# Patient Record
Sex: Female | Born: 1940
Health system: Southern US, Community
[De-identification: ages and names within clinical notes are randomized; demographics above are authoritative.]

## PROBLEM LIST (undated history)

## (undated) DIAGNOSIS — E785 Hyperlipidemia, unspecified: Secondary | ICD-10-CM

## (undated) DIAGNOSIS — F329 Major depressive disorder, single episode, unspecified: Secondary | ICD-10-CM

## (undated) DIAGNOSIS — I10 Essential (primary) hypertension: Secondary | ICD-10-CM

## (undated) DIAGNOSIS — F32A Depression, unspecified: Secondary | ICD-10-CM

## (undated) DIAGNOSIS — M6281 Muscle weakness (generalized): Secondary | ICD-10-CM

## (undated) HISTORY — PX: APPENDECTOMY: SHX54

## (undated) HISTORY — PX: ABDOMINAL HYSTERECTOMY: SHX81

---
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
## 2008-02-03 ENCOUNTER — Emergency Department (HOSPITAL_COMMUNITY): Admission: EM | Admit: 2008-02-03 | Discharge: 2008-02-03 | Payer: Self-pay | Admitting: Emergency Medicine

## 2012-09-01 ENCOUNTER — Other Ambulatory Visit (HOSPITAL_COMMUNITY): Payer: Self-pay | Admitting: Internal Medicine

## 2012-09-01 DIAGNOSIS — G459 Transient cerebral ischemic attack, unspecified: Secondary | ICD-10-CM

## 2012-09-05 ENCOUNTER — Ambulatory Visit (HOSPITAL_COMMUNITY)
Admission: RE | Admit: 2012-09-05 | Discharge: 2012-09-05 | Disposition: A | Discharge status: Home / Self Care | Payer: Medicare Other | Source: Ambulatory Visit | Attending: Internal Medicine | Admitting: Internal Medicine

## 2012-09-05 DIAGNOSIS — I658 Occlusion and stenosis of other precerebral arteries: Secondary | ICD-10-CM | POA: Insufficient documentation

## 2012-09-05 DIAGNOSIS — I10 Essential (primary) hypertension: Secondary | ICD-10-CM | POA: Insufficient documentation

## 2012-09-05 DIAGNOSIS — I6529 Occlusion and stenosis of unspecified carotid artery: Secondary | ICD-10-CM | POA: Insufficient documentation

## 2012-09-05 DIAGNOSIS — G459 Transient cerebral ischemic attack, unspecified: Secondary | ICD-10-CM | POA: Insufficient documentation

## 2012-09-05 DIAGNOSIS — F17200 Nicotine dependence, unspecified, uncomplicated: Secondary | ICD-10-CM | POA: Insufficient documentation

## 2013-11-03 ENCOUNTER — Other Ambulatory Visit (HOSPITAL_COMMUNITY): Payer: Self-pay | Admitting: Internal Medicine

## 2013-11-03 DIAGNOSIS — R1011 Right upper quadrant pain: Secondary | ICD-10-CM

## 2013-11-06 ENCOUNTER — Ambulatory Visit (HOSPITAL_COMMUNITY)
Admission: RE | Admit: 2013-11-06 | Discharge: 2013-11-06 | Disposition: A | Discharge status: Home / Self Care | Payer: Medicare Other | Source: Ambulatory Visit | Attending: Internal Medicine | Admitting: Internal Medicine

## 2013-11-06 DIAGNOSIS — R160 Hepatomegaly, not elsewhere classified: Secondary | ICD-10-CM | POA: Insufficient documentation

## 2013-11-06 DIAGNOSIS — R1011 Right upper quadrant pain: Secondary | ICD-10-CM

## 2013-11-06 DIAGNOSIS — K7689 Other specified diseases of liver: Secondary | ICD-10-CM | POA: Insufficient documentation

## 2014-04-11 ENCOUNTER — Other Ambulatory Visit (HOSPITAL_COMMUNITY): Payer: Self-pay | Admitting: Internal Medicine

## 2014-04-11 DIAGNOSIS — M8580 Other specified disorders of bone density and structure, unspecified site: Secondary | ICD-10-CM

## 2014-05-01 ENCOUNTER — Other Ambulatory Visit (HOSPITAL_COMMUNITY): Payer: Medicare Other

## 2014-12-15 ENCOUNTER — Encounter (HOSPITAL_COMMUNITY): Payer: Self-pay | Admitting: *Deleted

## 2014-12-15 ENCOUNTER — Emergency Department (HOSPITAL_COMMUNITY): Payer: Medicare Other

## 2014-12-15 ENCOUNTER — Emergency Department (HOSPITAL_COMMUNITY)
Admission: EM | Admit: 2014-12-15 | Discharge: 2014-12-16 | Disposition: A | Discharge status: Home / Self Care | Payer: Medicare Other | Attending: Emergency Medicine | Admitting: Emergency Medicine

## 2014-12-15 DIAGNOSIS — R109 Unspecified abdominal pain: Secondary | ICD-10-CM

## 2014-12-15 DIAGNOSIS — K8050 Calculus of bile duct without cholangitis or cholecystitis without obstruction: Secondary | ICD-10-CM | POA: Diagnosis not present

## 2014-12-15 DIAGNOSIS — I714 Abdominal aortic aneurysm, without rupture, unspecified: Secondary | ICD-10-CM

## 2014-12-15 DIAGNOSIS — R1011 Right upper quadrant pain: Secondary | ICD-10-CM | POA: Diagnosis present

## 2014-12-15 DIAGNOSIS — Z720 Tobacco use: Secondary | ICD-10-CM | POA: Diagnosis not present

## 2014-12-15 DIAGNOSIS — R112 Nausea with vomiting, unspecified: Secondary | ICD-10-CM

## 2014-12-15 LAB — COMPREHENSIVE METABOLIC PANEL
ALK PHOS: 80 U/L (ref 38–126)
ALT: 11 U/L — ABNORMAL LOW (ref 14–54)
ANION GAP: 11 (ref 5–15)
AST: 16 U/L (ref 15–41)
Albumin: 3.9 g/dL (ref 3.5–5.0)
BUN: 10 mg/dL (ref 6–20)
CALCIUM: 9.4 mg/dL (ref 8.9–10.3)
CO2: 26 mmol/L (ref 22–32)
Chloride: 101 mmol/L (ref 101–111)
Creatinine, Ser: 0.94 mg/dL (ref 0.44–1.00)
GFR, EST NON AFRICAN AMERICAN: 59 mL/min — AB (ref >60)
Glucose, Bld: 131 mg/dL — ABNORMAL HIGH (ref 65–99)
Potassium: 3.5 mmol/L (ref 3.5–5.1)
SODIUM: 138 mmol/L (ref 135–145)
TOTAL PROTEIN: 7.4 g/dL (ref 6.5–8.1)
Total Bilirubin: 0.4 mg/dL (ref 0.3–1.2)

## 2014-12-15 LAB — CBC WITH DIFFERENTIAL/PLATELET
BASOS ABS: 0 10*3/uL (ref 0.0–0.1)
BASOS PCT: 0 % (ref 0–1)
Eosinophils Absolute: 0.1 10*3/uL (ref 0.0–0.7)
Eosinophils Relative: 0 % (ref 0–5)
HEMATOCRIT: 40.9 % (ref 36.0–46.0)
HEMOGLOBIN: 14 g/dL (ref 12.0–15.0)
Lymphocytes Relative: 13 % (ref 12–46)
Lymphs Abs: 1.7 10*3/uL (ref 0.7–4.0)
MCH: 29.7 pg (ref 26.0–34.0)
MCHC: 34.2 g/dL (ref 30.0–36.0)
MCV: 86.8 fL (ref 78.0–100.0)
MONOS PCT: 5 % (ref 3–12)
Monocytes Absolute: 0.6 10*3/uL (ref 0.1–1.0)
NEUTROS ABS: 10.9 10*3/uL — AB (ref 1.7–7.7)
NEUTROS PCT: 82 % — AB (ref 43–77)
Platelets: 311 10*3/uL (ref 150–400)
RBC: 4.71 MIL/uL (ref 3.87–5.11)
RDW: 12.6 % (ref 11.5–15.5)
WBC: 13.4 10*3/uL — AB (ref 4.0–10.5)

## 2014-12-15 LAB — LIPASE, BLOOD: LIPASE: 30 U/L (ref 22–51)

## 2014-12-15 LAB — TROPONIN I

## 2014-12-15 LAB — URINALYSIS, ROUTINE W REFLEX MICROSCOPIC
Bilirubin Urine: NEGATIVE
Glucose, UA: NEGATIVE mg/dL
Ketones, ur: NEGATIVE mg/dL
NITRITE: NEGATIVE
SPECIFIC GRAVITY, URINE: 1.025 (ref 1.005–1.030)
UROBILINOGEN UA: 0.2 mg/dL (ref 0.0–1.0)
pH: 6 (ref 5.0–8.0)

## 2014-12-15 LAB — URINE MICROSCOPIC-ADD ON

## 2014-12-15 MED ORDER — MORPHINE SULFATE (PF) 4 MG/ML IV SOLN
4.0000 mg | Freq: Once | INTRAVENOUS | Status: AC
  Administered 2014-12-15: 4 mg via INTRAVENOUS
  Filled 2014-12-15: qty 1

## 2014-12-15 MED ORDER — ONDANSETRON HCL 4 MG/2ML IJ SOLN
4.0000 mg | Freq: Once | INTRAMUSCULAR | Status: AC
  Administered 2014-12-15: 4 mg via INTRAVENOUS

## 2014-12-15 MED ORDER — ONDANSETRON HCL 4 MG/2ML IJ SOLN
4.0000 mg | Freq: Once | INTRAMUSCULAR | Status: DC
  Filled 2014-12-15: qty 2

## 2014-12-15 MED ORDER — IOHEXOL 300 MG/ML  SOLN
50.0000 mL | Freq: Once | INTRAMUSCULAR | Status: AC | PRN
  Administered 2014-12-15: 50 mL via ORAL

## 2014-12-15 MED ORDER — IOHEXOL 300 MG/ML  SOLN
100.0000 mL | Freq: Once | INTRAMUSCULAR | Status: AC | PRN
  Administered 2014-12-15: 100 mL via INTRAVENOUS

## 2014-12-15 NOTE — ED Notes (Signed)
   12/15/14 2203  Abdominal  Gastrointestinal (WDL) X  Abdomen Inspection Soft  Tenderness Tender  Last Bowel Movement Date 12/15/14  pt present w/ upper abdominal pain. Tender in the RUQ. Pt says vomited last time at 1930.

## 2014-12-15 NOTE — ED Notes (Addendum)
Pt c/o right upper abdominal pain and n/v today. Pt states she felt like this a couple of days last week as well.

## 2014-12-16 ENCOUNTER — Emergency Department (HOSPITAL_COMMUNITY): Payer: Medicare Other

## 2014-12-16 LAB — COMPREHENSIVE METABOLIC PANEL
ALK PHOS: 71 U/L (ref 38–126)
ALT: 15 U/L (ref 14–54)
AST: 24 U/L (ref 15–41)
Albumin: 3.2 g/dL — ABNORMAL LOW (ref 3.5–5.0)
Anion gap: 6 (ref 5–15)
BUN: 8 mg/dL (ref 6–20)
CALCIUM: 8.4 mg/dL — AB (ref 8.9–10.3)
CHLORIDE: 105 mmol/L (ref 101–111)
CO2: 28 mmol/L (ref 22–32)
CREATININE: 0.92 mg/dL (ref 0.44–1.00)
GFR calc non Af Amer: >60 mL/min (ref >60)
GLUCOSE: 106 mg/dL — AB (ref 65–99)
Potassium: 4 mmol/L (ref 3.5–5.1)
SODIUM: 139 mmol/L (ref 135–145)
Total Bilirubin: 0.5 mg/dL (ref 0.3–1.2)
Total Protein: 6 g/dL — ABNORMAL LOW (ref 6.5–8.1)

## 2014-12-16 LAB — CBC WITH DIFFERENTIAL/PLATELET
BASOS ABS: 0 10*3/uL (ref 0.0–0.1)
Basophils Relative: 0 % (ref 0–1)
EOS ABS: 0.1 10*3/uL (ref 0.0–0.7)
EOS PCT: 2 % (ref 0–5)
HCT: 35.3 % — ABNORMAL LOW (ref 36.0–46.0)
HEMOGLOBIN: 11.9 g/dL — AB (ref 12.0–15.0)
LYMPHS ABS: 2.5 10*3/uL (ref 0.7–4.0)
LYMPHS PCT: 37 % (ref 12–46)
MCH: 29.6 pg (ref 26.0–34.0)
MCHC: 33.7 g/dL (ref 30.0–36.0)
MCV: 87.8 fL (ref 78.0–100.0)
Monocytes Absolute: 0.4 10*3/uL (ref 0.1–1.0)
Monocytes Relative: 6 % (ref 3–12)
NEUTROS PCT: 55 % (ref 43–77)
Neutro Abs: 3.7 10*3/uL (ref 1.7–7.7)
PLATELETS: 233 10*3/uL (ref 150–400)
RBC: 4.02 MIL/uL (ref 3.87–5.11)
RDW: 12.5 % (ref 11.5–15.5)
WBC: 6.7 10*3/uL (ref 4.0–10.5)

## 2014-12-16 MED ORDER — PIPERACILLIN-TAZOBACTAM 3.375 G IVPB 30 MIN
3.3750 g | Freq: Once | INTRAVENOUS | Status: AC
  Administered 2014-12-16: 3.375 g via INTRAVENOUS
  Filled 2014-12-16: qty 50

## 2014-12-16 MED ORDER — SODIUM CHLORIDE 0.9 % IV SOLN
1000.0000 mL | Freq: Once | INTRAVENOUS | Status: AC
  Administered 2014-12-16: 1000 mL via INTRAVENOUS

## 2014-12-16 MED ORDER — ONDANSETRON HCL 4 MG PO TABS: 4.0000 mg | ORAL_TABLET | Freq: Three times a day (TID) | ORAL | Status: AC | PRN

## 2014-12-16 MED ORDER — HYDROCODONE-ACETAMINOPHEN 5-325 MG PO TABS: ORAL_TABLET | ORAL | Status: AC

## 2014-12-16 MED ORDER — ONDANSETRON HCL 4 MG/2ML IJ SOLN
INTRAMUSCULAR | Status: AC
  Filled 2014-12-16: qty 2

## 2014-12-16 MED ORDER — ONDANSETRON HCL 4 MG/2ML IJ SOLN
4.0000 mg | Freq: Once | INTRAMUSCULAR | Status: AC
  Administered 2014-12-16: 4 mg via INTRAVENOUS

## 2014-12-16 MED ORDER — SODIUM CHLORIDE 0.9 % IV SOLN
1000.0000 mL | INTRAVENOUS | Status: DC
  Administered 2014-12-16: 1000 mL via INTRAVENOUS

## 2014-12-16 NOTE — ED Provider Notes (Signed)
CSN: [id number]     Arrival date & time 12/15/14  2039 History   First MD Initiated Contact with Patient 12/15/14 2215     Chief Complaint  Patient presents with  . Abdominal Pain     (Consider location/radiation/quality/duration/timing/severity/associated sxs/prior Treatment) The history is provided by the patient and a relative.   Morgan Estrada is a 74 y.o. female with no significant past medical history but with the surgical history including appendectomy and an abdominal hysterectomy presenting with right upper quadrant pain along with nausea and multiple episodes of vomiting which started early afternoon today after eating lunch.  She describes waking this morning with a feeling of pressure  "like a gas bubble" across her bilateral mid back which improved after moving around and passing flatus.  She reports multiple episodes of bilious emesis, denies hematemesis.  She has had no diarrhea or constipation, her last bowel movement was yesterday and normal.  She denies fevers or chills.  Pain has been constant but with waxing and waning pattern.  She has found no alleviating or spur her symptoms.  She also denies dysuria, hematuria, chest pain or shortness of breath.  Her last episode of emesis occurred an hour and half before arriving here tonight.     History reviewed. No pertinent past medical history. Past Surgical History  Procedure Laterality Date  . Appendectomy    . Abdominal hysterectomy     No family history on file. Social History  Substance Use Topics  . Smoking status: Current Every Day Smoker  . Smokeless tobacco: None  . Alcohol Use: No   OB History    No data available     Review of Systems  Constitutional: Negative for fever.  HENT: Negative for congestion and sore throat.   Eyes: Negative.   Respiratory: Negative for chest tightness and shortness of breath.   Cardiovascular: Negative for chest pain.  Gastrointestinal: Negative for nausea and abdominal pain.   Genitourinary: Negative.   Musculoskeletal: Negative for joint swelling, arthralgias and neck pain.  Skin: Negative.  Negative for rash and wound.  Neurological: Negative for dizziness, weakness, light-headedness, numbness and headaches.  Psychiatric/Behavioral: Negative.       Allergies  Review of patient's allergies indicates no known allergies.  Home Medications   Prior to Admission medications   Not on File   BP 185/90 mmHg  Pulse 83  Temp(Src) 97.7 F (36.5 C) (Oral)  Resp 20  Ht  (1.626 m)  Wt 155 lb (70.308 kg)  BMI 26.59 kg/m2  SpO2 100% Physical Exam  Constitutional: She appears well-developed and well-nourished.  Appears uncomfortable  HENT:  Head: Normocephalic and atraumatic.  Eyes: Conjunctivae are normal.  Neck: Normal range of motion.  Cardiovascular: Normal rate, regular rhythm and normal heart sounds.   Pulmonary/Chest: Effort normal and breath sounds normal. She has no wheezes. She exhibits no tenderness.  Abdominal: Soft. Bowel sounds are normal. She exhibits no distension and no mass. There is tenderness in the right upper quadrant. There is guarding. There is no rebound and no CVA tenderness.  Musculoskeletal: Normal range of motion.  Neurological: She is alert.  Skin: Skin is warm and dry.  Psychiatric: She has a normal mood and affect.  Nursing note and vitals reviewed.   ED Course  Procedures (including critical care time) Labs Review Labs Reviewed  COMPREHENSIVE METABOLIC PANEL - Abnormal; Notable for the following:    Glucose, Bld 131 (*)    ALT 11 (*)  (225)[place]07GeWhite Waterrit HeckeVa N California He na on Nationsllesenk R53olWomen'Alm665 Heloise [phone] 33Zenda AlpersMarland KitchenFrederica KusterEctorMalissa HippoLibertytownMirian Mo69mFabian SharpUticaRedge Gainer [phone] PurpuraAmbrose Mantle[address]Almond LintOceans [facility]al [facility]36Rolan BuccoNolon NationsKerin SalenAkaskaCherly Hensen[address]Trinna Post  [facility] Surgery CenterBryantGerrit HeckTad [phone] [phone] 65Zenda AlpersMarland KitchenFrederica KusterOld BenningtonMalissa HippoOasisMirian Mo73mFabian SharpCedartownRedge Gainer [phone] PurpuraAmbrose Mantle26 Holly StreetAlmond LintFayette County Hospital42Rolan BuccoNolon NationsKerin SalenWindow RockCherly Hensen73 Birchpond CourtTrinna Post  [facility] LLCNelsonvilleGerrit HeckTad [phone] [phone] 48Zenda Alpers  Culture sent.  No urinary sx per pt.  Gallstones on CT without acute cholecystitis.  Suspect biliary colic as source of sx.      Burgess Amor, PA-C 12/16/14 0139  Azalia Bilis, MD 12/16/14 [id number]

## 2014-12-16 NOTE — ED Notes (Signed)
Pt given water to drink. Tolerating it well.

## 2014-12-16 NOTE — ED Notes (Signed)
MD at bedside. 

## 2014-12-16 NOTE — ED Notes (Signed)
US at bedside

## 2014-12-16 NOTE — Discharge Instructions (Signed)
°Emergency Department Resource Guide °1) Find a Doctor and Pay Out of Pocket °Although you won't have to find out who is covered by your insurance plan, it is a good idea to ask around and get recommendations. You will then need to call the office and see if the doctor you have chosen will accept you as a new patient and what types of options they offer for patients who are self-pay. Some doctors offer discounts or will set up payment plans for their patients who do not have insurance, but you will need to ask so you aren't surprised when you get to your appointment. ° °2) Contact Your Local Health Department °Not all health departments have doctors that can see patients for sick visits, but many do, so it is worth a call to see if yours does. If you don't know where your local health department is, you can check in your phone book. The CDC also has a tool to help you locate your state's health department, and many state websites also have listings of all of their local health departments. ° °3) Find a Walk-in Clinic °If your illness is not likely to be very severe or complicated, you may want to try a walk in clinic. These are popping up all over the country in pharmacies, drugstores, and shopping centers. They're usually staffed by nurse practitioners or physician assistants that have been trained to treat common illnesses and complaints. They're usually fairly quick and inexpensive. However, if you have serious medical issues or chronic medical problems, these are probably not your best option. ° °No Primary Care Doctor: °- Call Health Connect at  832-8000 - they can help you locate a primary care doctor that  accepts your insurance, provides certain services, etc. °- Physician Referral Service- 1-800-533-3463 ° °Chronic Pain Problems: °Organization         Address  Phone   Notes  °[facility]  (336) 297-2271 Patients need to be referred by their primary care doctor.  ° °Medication  Assistance: °Organization         Address  Phone   Notes  °[place] [facility] [address] °[place], [place] [address] (336) 641-8030 --Must be a resident of [place] °-- Must have NO insurance coverage whatsoever (no Medicaid/ Medicare, etc.) °-- The pt. MUST have a primary care doctor that directs their care regularly and follows them in the community °  °[facility]  (866) 331-1348   °[employer] Way  (888) 892-1162   ° °Agencies that provide inexpensive medical care: °Organization         Address  Phone   Notes  °[facility]  (336) 832-8035   °[facility]    (336) 832-7272   °[facility] [address] °[place], [place] [address] (336) 832-4777   °[facility] [address], °[place] (336) 271-4999   °[facility]    (336) 373-0678   °[facility]    (336) 272-1050   °[facility] ° [address], [place] Phone:  (336) 832-4444, Fax:  (336) 832-4440 Hours of Operation:  9 am - 6 pm, M-F.  Also accepts Medicaid/Medicare and self-pay.  °[facility] ° [address], [place] Phone: (336) 832-3150, Fax: (336) [phone]. Hours of Operation:  8:30 am - 5:30 pm, M-F.  Also accepts Medicaid and self-pay.  °[facility] [place] [address], [place] Phone: (336) 878-6027   °[facility] [address], [place], [place] (336)723-1848, Ext. 123 Mondays & Thursdays: 7-9 AM.  First 15 patients are seen on a first come, first serve basis. °  ° °Medicaid-accepting [place] Providers: ° °Organization         Address  Phone   Notes  °[facility] [address], [place] (336) 641-2100 Also accepts self-pay patients.  °[facility] [address], [place] ° (336) 856-9996   °[facility] [address], [place]  (336) 288-8857   °[facility] [address] [place] [address], [place] (336) 299-7000   °Veita Bland [address], [place]  ° (336) 373-1557 Only accepts [place] Access Medicaid patients after they have their name applied to their card.  ° °Self-Pay (no insurance) in [place]: ° °Organization         Address  Phone   Notes  °Sickle Cell Patients, [facility] [address], [place] (336) 832-1970   °[facility] [address], [place] (336) 832-4400   °[facility] ° [address], [place] (336) 992-4800   °[facility]/Dr. Osei-Bonsu ° [address] [place] [address], [place] or [address], [place] (336) [phone] Phone number for both [place] and [place] locations is the same.  °[facility] [address], [place] (336) 299-0000   °[facility] [address] [place] [address], [place] or [address] (336) 852-7530 °(336) 878-2260   °[facility] [address], [place] (336) 350-1642, phone; (336) 294-5005, fax Sees patients 1st and 3rd Saturday of every month.  Must not qualify for public or private insurance (i.e. Medicaid, Medicare, [place] Health Choice, Veterans' Benefits) • Household income should be no more than 200% of the poverty level •The clinic cannot treat you if you are pregnant or think you are pregnant • Sexually transmitted diseases are not treated at the clinic.  ° ° °Dental Care: °Organization         Address  Phone  Notes  °[place] [facility] [address], [place] (336) 641-6152 Accepts children up to age 21 who are enrolled in Medicaid or [place] Health Choice; pregnant women with a Medicaid card; and children who have applied for Medicaid or [place] Health Choice, but were declined, whose parents can pay a reduced fee at time of service.  °[place]  [facility] [place]  [address], [place] (336) 641-7733 Accepts children up to age 21 who are enrolled in Medicaid or [place] Health Choice; pregnant women with a Medicaid card; and children who have applied for Medicaid or [place] Health Choice, but were declined, whose parents can pay a reduced fee at time of service.  °[facility] ° [address], [place] (336) 641-4533 Patients are seen by appointment only. Walk-ins are not accepted. [facility] will see patients 18 years of age and older. °Monday - Tuesday (8am-5pm) °Most Wednesdays (8:30-5pm) °$30 per visit, cash only  °[facility] ° [address], [place] (336) 641-4533 Patients are seen by appointment only. Walk-ins are not accepted. [facility] will see patients 18 years of age and older. °One   Wednesday Evening (Monthly: Volunteer Based).  $30 per visit, cash only  °[facility]  (919) 537-3737 for adults; Children under age 4, call Graduate Pediatric Dentistry at (919) [phone]. Children aged 4-14, please call (919) [phone] to request a pediatric application. ° Dental services are provided in all areas of dental care including fillings, crowns and bridges, complete and partial dentures, implants, gum treatment, root canals, and extractions. Preventive care is also provided. Treatment is provided to both adults and children. °Patients are selected via a lottery and there is often a waiting list. °  °[facility] [address], °[place] ° (336) 763-8833 [email or web address] °  °[facility] [address], [place], [place] (336)723-1848, Ext. 123 Second and Fourth Thursday of each month, opens at 6:30 AM; Clinic ends at 9 AM.  Patients are seen on a first-come first-served basis, and a limited number are seen during each clinic.  ° °[facility] ° [address], [place], [place] (336) [phone]    Eligibility Requirements °You must have lived in [place], [place], or [place] counties for at least the last three months. °  You cannot be eligible for state or federal sponsored healthcare insurance, including Veterans Administration, Medicaid, or Medicare. °  You generally cannot be eligible for healthcare insurance through your employer.  °  How to apply: °Eligibility screenings are held every Tuesday and Wednesday afternoon from 1:00 pm until 4:00 pm. You do not need an appointment for the interview!  °[facility] [address], [place], [place] 336-631-2330   °[facility]  336-342-8273   °[facility]  336-703-3100   °[facility]  336-570-6415   ° °Behavioral Health Resources in the Community: °Intensive Outpatient Programs °Organization         Address  Phone  Notes  °[facility] [address], [place], [place] 336-878-6098   °[facility] [address], [place], [place] 336-832-9800   °[facility] [address], [place], [place] ° 336-882-2125   °[facility] [address],  °[place], [place] 1-800-853-5163 or 336-641-4981   °Substance Abuse Resources °Organization         Address  Phone  Notes  °[facility]  336-882-2125   °[facility]  336-784-9470   °[facility]  336-285-9073   °[facility]  336-845-3988   °Residential & Outpatient Substance Abuse Program  1-800-659-3381   °Psychological Services °Organization         Address  Phone  Notes  °[facility]  336- 832-9600   °[facility]  336- 378-7881   °[facility] [address], [place] 1-800-853-5163 or 336-641-4981   ° °Mobile Crisis Teams °Organization         Address  Phone  Notes  °[facility], Mobile Crisis Care Unit  1-877-626-1772   °Assertive °[facility] ° [address]  [place], [place] 336-834-9664   °Sharon DeEsch [address] °[place] 336-554-5454   ° °Self-Help/Support Groups °Organization         Address  Phone             Notes  °[facility] of  - variety of support groups  336- 373-1402 Call for more information  °Narcotics Anonymous (NA), [facility] [address], °[place]  2 meetings at this location  ° °  Residential Treatment Programs Organization         Address  Phone  Notes  [facility] [address],    [place]  1-610-960-4540   [facility]  [address], [place] [address], [place], [place] [phone]   [facility] [address] [place], [place] 086-578-4696 Admissions: 8am-3pm M-F  [facility] [address],    [place], [place] 295-284-1324   [facility] [address] [place], [place], [place] 401-027-2536   [facility] [address]  [place], [place] 644-034-7425   [facility] Intensive Outpatient [address], [place], [place] 956-387-5643   [facility] ([facility] [address] [place]  [place], [place] 3-295-188-4166 or 831-833-1834   [facility] ([facility] [address], [place], [place] 323-557-3220 Accepts Medicaid  [facility] [address],  [place] 2-542-706-2376 Substance Abuse/Addiction Treatment   [facility] Organization         Address  Phone  Notes  [facility]  (781)566-6649   Angie Fava, PhD [address] Ervin Knack [place], [place]   470 273 8893 or (864)338-6732   [facility]   [address] [place], [place] (971)318-6048   [facility] [address] [place], [place] 438-188-8811 Insurance/Medicaid/sponsorship through [facility] and Families [address]                                    [place], [place] [phone] Therapy/tele-psych/case    [facility] [address], [place] [phone]    Dr. Lolly Mustache  502-849-2118   [facility]  [employer] [facility] Dept. 1) [address], [place] 2) [address] [place] 3)  [address], [place] [phone]  (519)[phone]   [facility] Child Abuse Hotline 828-504-4650 or 902-340-0006 (After Hours)      Eat a bland diet, avoiding greasy, fatty, fried foods, as well as spicy and acidic foods or beverages.  Avoid eating within the hour or 2 before going to bed or laying down.  Also avoid teas, colas, coffee, chocolate, pepermint and spearment.  Take the prescriptions as directed. Your CT scan showed an incidental finding of: "34 mm abdominal aortic aneurysm. Recommend followup by ultrasound in 3 years. This recommendation follows ACR consensus guidelines:  White Paper of the ACR Incidental Findings Committee II on Vascular Findings. J Am Coll Radiol 2013; 10:789-794."   Call your regular medical doctor tomorrow to schedule a follow up appointment in the next 2 days. Call the Vascular Surgeon tomorrow to schedule a follow up appointment within the next week regarding your aneurysm. Call the General Surgeon tomorrow to schedule a follow up appointment within the next week. Return to the Emergency Department immediately if worsening.

## 2014-12-16 NOTE — ED Provider Notes (Signed)
Pt received at change of shift with labs and [place] abd pending. Udip appears contaminated, UC is pending. Repeat labs reassuring. [place] without acute abnl. Pt has not required any further pain or nausea meds while in the ED since overnight last night. Pt has tol PO well while in the ED without N/V.  No stooling while in the ED.  Abd benign, VSS. Feels better and wants to go home now. Dx and testing d/w pt and family.  Questions answered.  Verb understanding, agreeable to d/c home with outpt f/u.   Results for orders placed or performed during the hospital encounter of 12/15/14  Comprehensive metabolic panel  Result Value Ref Range   Sodium 138 135 - 145 mmol/L   Potassium 3.5 3.5 - 5.1 mmol/L   Chloride 101 101 - 111 mmol/L   CO2 26 22 - 32 mmol/L   Glucose, Bld 131 (H) 65 - 99 mg/dL   BUN 10 6 - 20 mg/dL   Creatinine, Ser 8.11 0.44 - 1.00 mg/dL   Calcium 9.4 8.9 - 91.4 mg/dL   Total Protein 7.4 6.5 - 8.1 g/dL   Albumin 3.9 3.5 - 5.0 g/dL   AST 16 15 - 41 U/L   ALT 11 (L) 14 - 54 U/L   Alkaline Phosphatase 80 38 - 126 U/L   Total Bilirubin 0.4 0.3 - 1.2 mg/dL   GFR calc non Af Amer 59 (L) >60 mL/min   GFR calc Af Amer >60 >60 mL/min   Anion gap 11 5 - 15  CBC with Differential  Result Value Ref Range   WBC 13.4 (H) 4.0 - 10.5 K/uL   RBC 4.71 3.87 - 5.11 MIL/uL   Hemoglobin 14.0 12.0 - 15.0 g/dL   HCT 78.2 95.6 - 21.3 %   MCV 86.8 78.0 - 100.0 fL   MCH 29.7 26.0 - 34.0 pg   MCHC 34.2 30.0 - 36.0 g/dL   RDW 08.6 57.8 - 46.9 %   Platelets 311 150 - 400 K/uL   Neutrophils Relative % 82 (H) 43 - 77 %   Neutro Abs 10.9 (H) 1.7 - 7.7 K/uL   Lymphocytes Relative 13 12 - 46 %   Lymphs Abs 1.7 0.7 - 4.0 K/uL   Monocytes Relative 5 3 - 12 %   Monocytes Absolute 0.6 0.1 - 1.0 K/uL   Eosinophils Relative 0 0 - 5 %   Eosinophils Absolute 0.1 0.0 - 0.7 K/uL   Basophils Relative 0 0 - 1 %   Basophils Absolute 0.0 0.0 - 0.1 K/uL  Urinalysis, Routine w reflex microscopic (not at [facility])  Result  Value Ref Range   Color, Urine YELLOW YELLOW   APPearance HAZY (A) CLEAR   Specific Gravity, Urine 1.025 1.005 - 1.030   pH 6.0 5.0 - 8.0   Glucose, UA NEGATIVE NEGATIVE mg/dL   Hgb urine dipstick TRACE (A) NEGATIVE   Bilirubin Urine NEGATIVE NEGATIVE   Ketones, ur NEGATIVE NEGATIVE mg/dL   Protein, ur TRACE (A) NEGATIVE mg/dL   Urobilinogen, UA 0.2 0.0 - 1.0 mg/dL   Nitrite NEGATIVE NEGATIVE   Leukocytes, UA SMALL (A) NEGATIVE  Lipase, blood  Result Value Ref Range   Lipase 30 22 - 51 U/L  Troponin I  Result Value Ref Range   Troponin I <0.03 <0.031 ng/mL  Urine microscopic-add on  Result Value Ref Range   Squamous Epithelial / LPF MANY (A) RARE   WBC, UA 7-10 <3 WBC/hpf   RBC / HPF 3-6 <  3 RBC/hpf   Bacteria, UA MANY (A) RARE   Crystals CA OXALATE CRYSTALS (A) NEGATIVE  CBC with Differential/Platelet  Result Value Ref Range   WBC 6.7 4.0 - 10.5 K/uL   RBC 4.02 3.87 - 5.11 MIL/uL   Hemoglobin 11.9 (L) 12.0 - 15.0 g/dL   HCT 16.1 (L) 09.6 - 04.5 %   MCV 87.8 78.0 - 100.0 fL   MCH 29.6 26.0 - 34.0 pg   MCHC 33.7 30.0 - 36.0 g/dL   RDW 40.9 81.1 - 91.4 %   Platelets 233 150 - 400 K/uL   Neutrophils Relative % 55 43 - 77 %   Neutro Abs 3.7 1.7 - 7.7 K/uL   Lymphocytes Relative 37 12 - 46 %   Lymphs Abs 2.5 0.7 - 4.0 K/uL   Monocytes Relative 6 3 - 12 %   Monocytes Absolute 0.4 0.1 - 1.0 K/uL   Eosinophils Relative 2 0 - 5 %   Eosinophils Absolute 0.1 0.0 - 0.7 K/uL   Basophils Relative 0 0 - 1 %   Basophils Absolute 0.0 0.0 - 0.1 K/uL  Comprehensive metabolic panel  Result Value Ref Range   Sodium 139 135 - 145 mmol/L   Potassium 4.0 3.5 - 5.1 mmol/L   Chloride 105 101 - 111 mmol/L   CO2 28 22 - 32 mmol/L   Glucose, Bld 106 (H) 65 - 99 mg/dL   BUN 8 6 - 20 mg/dL   Creatinine, Ser 7.82 0.44 - 1.00 mg/dL   Calcium 8.4 (L) 8.9 - 10.3 mg/dL   Total Protein 6.0 (L) 6.5 - 8.1 g/dL   Albumin 3.2 (L) 3.5 - 5.0 g/dL   AST 24 15 - 41 U/L   ALT 15 14 - 54 U/L   Alkaline  Phosphatase 71 38 - 126 U/L   Total Bilirubin 0.5 0.3 - 1.2 mg/dL   GFR calc non Af Amer >60 >60 mL/min   GFR calc Af Amer >60 >60 mL/min   Anion gap 6 5 - 15   US Abdomen Limited 12/16/2014   CLINICAL DATA:  Right upper quadrant pain. Cholelithiasis suspected on recent CT.  EXAM: US ABDOMEN LIMITED - RIGHT UPPER QUADRANT  COMPARISON:  11/06/2013  FINDINGS: Gallbladder:  No gallstones or wall thickening visualized. No sonographic Murphy sign noted.  Common bile duct:  Diameter: 4 mm, within normal limits  Liver:  Diffusely increased echogenicity of the hepatic parenchyma, consistent with diffuse hepatic steatosis. No focal mass lesion identified.  IMPRESSION: No evidence of cholelithiasis or biliary dilatation.  Diffuse hepatic steatosis.   Electronically Signed   By: Myles Rosenthal M.D.   On: 12/16/2014 09:37      Samuel Jester, DO 12/16/14 1159

## 2014-12-18 LAB — URINE CULTURE

## 2014-12-26 ENCOUNTER — Other Ambulatory Visit (HOSPITAL_COMMUNITY): Payer: Self-pay | Admitting: Internal Medicine

## 2014-12-26 DIAGNOSIS — R1011 Right upper quadrant pain: Secondary | ICD-10-CM

## 2015-01-02 ENCOUNTER — Encounter (HOSPITAL_COMMUNITY): Payer: Self-pay

## 2015-01-02 ENCOUNTER — Encounter (HOSPITAL_COMMUNITY)
Admission: RE | Admit: 2015-01-02 | Discharge: 2015-01-02 | Disposition: A | Discharge status: Home / Self Care | Payer: Medicare Other | Source: Ambulatory Visit | Attending: Internal Medicine | Admitting: Internal Medicine

## 2015-01-02 DIAGNOSIS — R1011 Right upper quadrant pain: Secondary | ICD-10-CM | POA: Diagnosis not present

## 2015-01-02 DIAGNOSIS — R112 Nausea with vomiting, unspecified: Secondary | ICD-10-CM | POA: Diagnosis not present

## 2015-01-02 MED ORDER — TECHNETIUM TC 99M MEBROFENIN IV KIT
5.0000 | PACK | Freq: Once | INTRAVENOUS | Status: DC | PRN
  Administered 2015-01-02: 5.3 via INTRAVENOUS
  Filled 2015-01-02: qty 6

## 2015-01-02 MED ORDER — STERILE WATER FOR INJECTION IJ SOLN
INTRAMUSCULAR | Status: AC
  Administered 2015-01-02: 1.41 mL
  Filled 2015-01-02: qty 10

## 2015-01-02 MED ORDER — SODIUM CHLORIDE 0.9 % IJ SOLN
INTRAMUSCULAR | Status: AC
  Filled 2015-01-02: qty 30

## 2015-01-02 MED ORDER — SINCALIDE 5 MCG IJ SOLR
INTRAMUSCULAR | Status: AC
  Administered 2015-01-02: 1.41 ug
  Filled 2015-01-02: qty 5

## 2015-08-10 ENCOUNTER — Emergency Department (HOSPITAL_COMMUNITY): Payer: Medicare Other | Admitting: Anesthesiology

## 2015-08-10 ENCOUNTER — Emergency Department (HOSPITAL_COMMUNITY): Payer: Medicare Other

## 2015-08-10 ENCOUNTER — Encounter (HOSPITAL_COMMUNITY): Payer: Self-pay | Admitting: Emergency Medicine

## 2015-08-10 ENCOUNTER — Inpatient Hospital Stay (HOSPITAL_COMMUNITY)
Admission: EM | Admit: 2015-08-10 | Discharge: 2015-08-18 | DRG: 326 | Disposition: A | Discharge status: Home Health | Payer: Medicare Other | Attending: Surgery | Admitting: Surgery

## 2015-08-10 ENCOUNTER — Encounter (HOSPITAL_COMMUNITY): Admission: EM | Disposition: A | Discharge status: Home Health | Payer: Self-pay | Source: Home / Self Care

## 2015-08-10 DIAGNOSIS — R000 Tachycardia, unspecified: Secondary | ICD-10-CM | POA: Diagnosis present

## 2015-08-10 DIAGNOSIS — Z90710 Acquired absence of both cervix and uterus: Secondary | ICD-10-CM | POA: Diagnosis not present

## 2015-08-10 DIAGNOSIS — K275 Chronic or unspecified peptic ulcer, site unspecified, with perforation: Secondary | ICD-10-CM | POA: Diagnosis present

## 2015-08-10 DIAGNOSIS — Z9049 Acquired absence of other specified parts of digestive tract: Secondary | ICD-10-CM

## 2015-08-10 DIAGNOSIS — K659 Peritonitis, unspecified: Secondary | ICD-10-CM | POA: Diagnosis present

## 2015-08-10 DIAGNOSIS — B9681 Helicobacter pylori [H. pylori] as the cause of diseases classified elsewhere: Secondary | ICD-10-CM | POA: Diagnosis not present

## 2015-08-10 DIAGNOSIS — K255 Chronic or unspecified gastric ulcer with perforation: Secondary | ICD-10-CM | POA: Diagnosis present

## 2015-08-10 DIAGNOSIS — R198 Other specified symptoms and signs involving the digestive system and abdomen: Secondary | ICD-10-CM | POA: Diagnosis present

## 2015-08-10 DIAGNOSIS — Z7982 Long term (current) use of aspirin: Secondary | ICD-10-CM | POA: Diagnosis not present

## 2015-08-10 DIAGNOSIS — F17210 Nicotine dependence, cigarettes, uncomplicated: Secondary | ICD-10-CM | POA: Diagnosis present

## 2015-08-10 DIAGNOSIS — J449 Chronic obstructive pulmonary disease, unspecified: Secondary | ICD-10-CM | POA: Diagnosis present

## 2015-08-10 DIAGNOSIS — K668 Other specified disorders of peritoneum: Secondary | ICD-10-CM

## 2015-08-10 DIAGNOSIS — R410 Disorientation, unspecified: Secondary | ICD-10-CM | POA: Diagnosis present

## 2015-08-10 DIAGNOSIS — M549 Dorsalgia, unspecified: Secondary | ICD-10-CM | POA: Diagnosis present

## 2015-08-10 DIAGNOSIS — I714 Abdominal aortic aneurysm, without rupture: Secondary | ICD-10-CM | POA: Diagnosis present

## 2015-08-10 DIAGNOSIS — R1084 Generalized abdominal pain: Secondary | ICD-10-CM | POA: Diagnosis present

## 2015-08-10 HISTORY — PX: LAPAROTOMY: SHX154

## 2015-08-10 LAB — BASIC METABOLIC PANEL
ANION GAP: 10 (ref 5–15)
BUN: 13 mg/dL (ref 6–20)
CALCIUM: 9 mg/dL (ref 8.9–10.3)
CHLORIDE: 100 mmol/L — AB (ref 101–111)
CO2: 25 mmol/L (ref 22–32)
CREATININE: 0.98 mg/dL (ref 0.44–1.00)
GFR, EST NON AFRICAN AMERICAN: 56 mL/min — AB (ref >60)
Glucose, Bld: 131 mg/dL — ABNORMAL HIGH (ref 65–99)
POTASSIUM: 4.4 mmol/L (ref 3.5–5.1)
SODIUM: 135 mmol/L (ref 135–145)

## 2015-08-10 LAB — URINALYSIS, ROUTINE W REFLEX MICROSCOPIC
BILIRUBIN URINE: NEGATIVE
Glucose, UA: NEGATIVE mg/dL
HGB URINE DIPSTICK: NEGATIVE
Ketones, ur: NEGATIVE mg/dL
NITRITE: NEGATIVE
Protein, ur: NEGATIVE mg/dL
Specific Gravity, Urine: 1.01 (ref 1.005–1.030)
pH: 7.5 (ref 5.0–8.0)

## 2015-08-10 LAB — CBC WITH DIFFERENTIAL/PLATELET
BASOS ABS: 0 10*3/uL (ref 0.0–0.1)
Basophils Relative: 0 %
EOS ABS: 0 10*3/uL (ref 0.0–0.7)
EOS PCT: 0 %
HCT: 42 % (ref 36.0–46.0)
HEMOGLOBIN: 13.8 g/dL (ref 12.0–15.0)
LYMPHS ABS: 1.5 10*3/uL (ref 0.7–4.0)
Lymphocytes Relative: 7 %
MCH: 28.6 pg (ref 26.0–34.0)
MCHC: 32.9 g/dL (ref 30.0–36.0)
MCV: 87.1 fL (ref 78.0–100.0)
Monocytes Absolute: 0.9 10*3/uL (ref 0.1–1.0)
Monocytes Relative: 4 %
NEUTROS PCT: 89 %
Neutro Abs: 19.6 10*3/uL — ABNORMAL HIGH (ref 1.7–7.7)
PLATELETS: 219 10*3/uL (ref 150–400)
RBC: 4.82 MIL/uL (ref 3.87–5.11)
RDW: 14.2 % (ref 11.5–15.5)
WBC: 22 10*3/uL — AB (ref 4.0–10.5)

## 2015-08-10 LAB — CBC
HCT: 39.5 % (ref 36.0–46.0)
HEMOGLOBIN: 13.5 g/dL (ref 12.0–15.0)
MCH: 29.2 pg (ref 26.0–34.0)
MCHC: 34.2 g/dL (ref 30.0–36.0)
MCV: 85.5 fL (ref 78.0–100.0)
PLATELETS: 189 10*3/uL (ref 150–400)
RBC: 4.62 MIL/uL (ref 3.87–5.11)
RDW: 14.5 % (ref 11.5–15.5)
WBC: 22.8 10*3/uL — ABNORMAL HIGH (ref 4.0–10.5)

## 2015-08-10 LAB — HEPATIC FUNCTION PANEL
ALT: 13 U/L — ABNORMAL LOW (ref 14–54)
AST: 19 U/L (ref 15–41)
Albumin: 3.7 g/dL (ref 3.5–5.0)
Alkaline Phosphatase: 82 U/L (ref 38–126)
BILIRUBIN INDIRECT: 0.5 mg/dL (ref 0.3–0.9)
Bilirubin, Direct: 0.2 mg/dL (ref 0.1–0.5)
TOTAL PROTEIN: 7.2 g/dL (ref 6.5–8.1)
Total Bilirubin: 0.7 mg/dL (ref 0.3–1.2)

## 2015-08-10 LAB — URINE MICROSCOPIC-ADD ON

## 2015-08-10 LAB — CREATININE, SERUM
CREATININE: 0.86 mg/dL (ref 0.44–1.00)
GFR calc Af Amer: >60 mL/min (ref >60)

## 2015-08-10 LAB — LACTIC ACID, PLASMA
LACTIC ACID, VENOUS: 2 mmol/L (ref 0.5–2.0)
LACTIC ACID, VENOUS: 1.3 mmol/L (ref 0.5–2.0)

## 2015-08-10 LAB — LIPASE, BLOOD: LIPASE: 24 U/L (ref 11–51)

## 2015-08-10 SURGERY — LAPAROTOMY, EXPLORATORY
Anesthesia: General

## 2015-08-10 MED ORDER — DIPHENHYDRAMINE HCL 50 MG/ML IJ SOLN: 12.5000 mg | Freq: Four times a day (QID) | INTRAMUSCULAR | Status: DC | PRN

## 2015-08-10 MED ORDER — ONDANSETRON HCL 4 MG/2ML IJ SOLN
INTRAMUSCULAR | Status: DC | PRN
  Administered 2015-08-10: 4 mg via INTRAVENOUS

## 2015-08-10 MED ORDER — DIPHENHYDRAMINE HCL 12.5 MG/5ML PO ELIX: 12.5000 mg | ORAL_SOLUTION | Freq: Four times a day (QID) | ORAL | Status: DC | PRN

## 2015-08-10 MED ORDER — ENOXAPARIN SODIUM 40 MG/0.4ML ~~LOC~~ SOLN
40.0000 mg | SUBCUTANEOUS | Status: DC
  Administered 2015-08-11 – 2015-08-17 (×7): 40 mg via SUBCUTANEOUS
  Filled 2015-08-10 (×8): qty 0.4

## 2015-08-10 MED ORDER — DIATRIZOATE MEGLUMINE & SODIUM 66-10 % PO SOLN
ORAL | Status: AC
  Administered 2015-08-10: 30 mL
  Filled 2015-08-10: qty 30

## 2015-08-10 MED ORDER — CETYLPYRIDINIUM CHLORIDE 0.05 % MT LIQD
7.0000 mL | Freq: Two times a day (BID) | OROMUCOSAL | Status: DC
  Administered 2015-08-11 – 2015-08-18 (×13): 7 mL via OROMUCOSAL

## 2015-08-10 MED ORDER — LIDOCAINE HCL (CARDIAC) 20 MG/ML IV SOLN
INTRAVENOUS | Status: DC | PRN
  Administered 2015-08-10: 100 mg via INTRAVENOUS

## 2015-08-10 MED ORDER — METOCLOPRAMIDE HCL 5 MG/ML IJ SOLN
INTRAMUSCULAR | Status: DC | PRN
  Administered 2015-08-10: 10 mg via INTRAVENOUS

## 2015-08-10 MED ORDER — SODIUM CHLORIDE 0.9 % IR SOLN
Status: DC | PRN
  Administered 2015-08-10 (×2): 1000 mL

## 2015-08-10 MED ORDER — LACTATED RINGERS IV SOLN
INTRAVENOUS | Status: DC | PRN
  Administered 2015-08-10 (×2): via INTRAVENOUS

## 2015-08-10 MED ORDER — SUGAMMADEX SODIUM 200 MG/2ML IV SOLN
INTRAVENOUS | Status: DC | PRN
  Administered 2015-08-10: 200 mg via INTRAVENOUS

## 2015-08-10 MED ORDER — ROCURONIUM BROMIDE 100 MG/10ML IV SOLN
INTRAVENOUS | Status: DC | PRN
  Administered 2015-08-10: 5 mg via INTRAVENOUS
  Administered 2015-08-10: 30 mg via INTRAVENOUS

## 2015-08-10 MED ORDER — KCL IN DEXTROSE-NACL 20-5-0.45 MEQ/L-%-% IV SOLN
INTRAVENOUS | Status: DC
Start: 2015-08-10 — End: 2015-08-12
  Administered 2015-08-10 – 2015-08-11 (×2): via INTRAVENOUS
  Filled 2015-08-10 (×3): qty 1000

## 2015-08-10 MED ORDER — DEXAMETHASONE SODIUM PHOSPHATE 10 MG/ML IJ SOLN
INTRAMUSCULAR | Status: DC | PRN
  Administered 2015-08-10: 10 mg via INTRAVENOUS

## 2015-08-10 MED ORDER — PHENYLEPHRINE HCL 10 MG/ML IJ SOLN
INTRAMUSCULAR | Status: DC | PRN
  Administered 2015-08-10 (×3): 80 ug via INTRAVENOUS

## 2015-08-10 MED ORDER — SODIUM CHLORIDE 0.9% FLUSH: 9.0000 mL | INTRAVENOUS | Status: DC | PRN

## 2015-08-10 MED ORDER — SODIUM CHLORIDE 0.9 % IV BOLUS (SEPSIS)
1000.0000 mL | Freq: Once | INTRAVENOUS | Status: AC
  Administered 2015-08-10: 1000 mL via INTRAVENOUS

## 2015-08-10 MED ORDER — IPRATROPIUM-ALBUTEROL 0.5-2.5 (3) MG/3ML IN SOLN
3.0000 mL | RESPIRATORY_TRACT | Status: DC
  Administered 2015-08-10: 3 mL via RESPIRATORY_TRACT
  Filled 2015-08-10: qty 3

## 2015-08-10 MED ORDER — PIPERACILLIN-TAZOBACTAM 3.375 G IVPB
3.3750 g | Freq: Three times a day (TID) | INTRAVENOUS | Status: AC
  Administered 2015-08-10 – 2015-08-16 (×19): 3.375 g via INTRAVENOUS
  Filled 2015-08-10 (×20): qty 50

## 2015-08-10 MED ORDER — MORPHINE SULFATE (PF) 4 MG/ML IV SOLN
6.0000 mg | Freq: Once | INTRAVENOUS | Status: AC
  Administered 2015-08-10: 6 mg via INTRAVENOUS
  Filled 2015-08-10: qty 2

## 2015-08-10 MED ORDER — PIPERACILLIN-TAZOBACTAM 3.375 G IVPB 30 MIN
3.3750 g | Freq: Once | INTRAVENOUS | Status: AC
  Administered 2015-08-10: 3.375 g via INTRAVENOUS
  Filled 2015-08-10: qty 50

## 2015-08-10 MED ORDER — FENTANYL CITRATE (PF) 100 MCG/2ML IJ SOLN
INTRAMUSCULAR | Status: DC | PRN
  Administered 2015-08-10: 50 ug via INTRAVENOUS
  Administered 2015-08-10: 100 ug via INTRAVENOUS

## 2015-08-10 MED ORDER — PROPOFOL 10 MG/ML IV BOLUS
INTRAVENOUS | Status: DC | PRN
  Administered 2015-08-10: 150 mg via INTRAVENOUS

## 2015-08-10 MED ORDER — PANTOPRAZOLE SODIUM 40 MG IV SOLR
40.0000 mg | Freq: Two times a day (BID) | INTRAVENOUS | Status: DC
  Administered 2015-08-10 – 2015-08-16 (×12): 40 mg via INTRAVENOUS
  Filled 2015-08-10 (×13): qty 40

## 2015-08-10 MED ORDER — IPRATROPIUM-ALBUTEROL 0.5-2.5 (3) MG/3ML IN SOLN: 3.0000 mL | Freq: Four times a day (QID) | RESPIRATORY_TRACT | Status: DC | PRN

## 2015-08-10 MED ORDER — ONDANSETRON HCL 4 MG/2ML IJ SOLN
4.0000 mg | Freq: Four times a day (QID) | INTRAMUSCULAR | Status: DC | PRN
  Administered 2015-08-10: 4 mg via INTRAVENOUS

## 2015-08-10 MED ORDER — MORPHINE SULFATE 2 MG/ML IV SOLN
INTRAVENOUS | Status: DC
  Administered 2015-08-10: 23:00:00 via INTRAVENOUS
  Administered 2015-08-11: 13 mL via INTRAVENOUS
  Administered 2015-08-11: 8 mg via INTRAVENOUS
  Administered 2015-08-11: 0 mg via INTRAVENOUS
  Administered 2015-08-11: 1 mg via INTRAVENOUS
  Administered 2015-08-12: 7 mL via INTRAVENOUS
  Administered 2015-08-12: 0 mL via INTRAVENOUS

## 2015-08-10 MED ORDER — NALOXONE HCL 0.4 MG/ML IJ SOLN: 0.4000 mg | INTRAMUSCULAR | Status: DC | PRN

## 2015-08-10 MED ORDER — ONDANSETRON HCL 4 MG/2ML IJ SOLN
4.0000 mg | Freq: Four times a day (QID) | INTRAMUSCULAR | Status: DC | PRN
  Administered 2015-08-11 – 2015-08-16 (×5): 4 mg via INTRAVENOUS
  Filled 2015-08-10 (×6): qty 2

## 2015-08-10 MED ORDER — ONDANSETRON HCL 4 MG/2ML IJ SOLN
4.0000 mg | Freq: Once | INTRAMUSCULAR | Status: AC
  Administered 2015-08-10: 4 mg via INTRAVENOUS
  Filled 2015-08-10: qty 2

## 2015-08-10 MED ORDER — HYDROMORPHONE HCL 1 MG/ML IJ SOLN
0.2500 mg | INTRAMUSCULAR | Status: DC | PRN
  Administered 2015-08-10 (×4): 0.5 mg via INTRAVENOUS

## 2015-08-10 MED ORDER — IOPAMIDOL (ISOVUE-300) INJECTION 61%
100.0000 mL | Freq: Once | INTRAVENOUS | Status: AC | PRN
  Administered 2015-08-10: 100 mL via INTRAVENOUS

## 2015-08-10 MED ORDER — ONDANSETRON 4 MG PO TBDP
4.0000 mg | ORAL_TABLET | Freq: Four times a day (QID) | ORAL | Status: DC | PRN
  Filled 2015-08-10: qty 1

## 2015-08-10 SURGICAL SUPPLY — 38 items
APPLICATOR COTTON TIP 6IN STRL (MISCELLANEOUS) ×3 IMPLANT
BLADE EXTENDED COATED 6.5IN (ELECTRODE) IMPLANT
BLADE HEX COATED 2.75 (ELECTRODE) ×3 IMPLANT
COVER MAYO STAND STRL (DRAPES) IMPLANT
COVER SURGICAL LIGHT HANDLE (MISCELLANEOUS) ×3 IMPLANT
DRAPE LAPAROSCOPIC ABDOMINAL (DRAPES) ×3 IMPLANT
DRAPE WARM FLUID 44X44 (DRAPE) IMPLANT
DRSG OPSITE POSTOP 4X8 (GAUZE/BANDAGES/DRESSINGS) ×3 IMPLANT
ELECT REM PT RETURN 9FT ADLT (ELECTROSURGICAL) ×3
ELECTRODE REM PT RTRN 9FT ADLT (ELECTROSURGICAL) ×1 IMPLANT
GAUZE SPONGE 4X4 12PLY STRL (GAUZE/BANDAGES/DRESSINGS) ×3 IMPLANT
GLOVE BIOGEL PI IND STRL 7.0 (GLOVE) ×1 IMPLANT
GLOVE BIOGEL PI INDICATOR 7.0 (GLOVE) ×2
GLOVE SURG ORTHO 8.0 STRL STRW (GLOVE) ×3 IMPLANT
GOWN STRL REUS W/TWL LRG LVL3 (GOWN DISPOSABLE) ×3 IMPLANT
GOWN STRL REUS W/TWL XL LVL3 (GOWN DISPOSABLE) ×6 IMPLANT
HANDLE SUCTION POOLE (INSTRUMENTS) IMPLANT
KIT BASIN OR (CUSTOM PROCEDURE TRAY) ×3 IMPLANT
NS IRRIG 1000ML POUR BTL (IV SOLUTION) ×3 IMPLANT
PACK GENERAL/GYN (CUSTOM PROCEDURE TRAY) ×3 IMPLANT
SPONGE LAP 18X18 X RAY DECT (DISPOSABLE) IMPLANT
STAPLER VISISTAT 35W (STAPLE) ×3 IMPLANT
SUCTION POOLE HANDLE (INSTRUMENTS)
SUT NOV 1 T60/GS (SUTURE) IMPLANT
SUT NOVA NAB GS-21 0 18 T12 DT (SUTURE) ×12 IMPLANT
SUT SILK 2 0 (SUTURE)
SUT SILK 2 0 SH CR/8 (SUTURE) IMPLANT
SUT SILK 2-0 18XBRD TIE 12 (SUTURE) IMPLANT
SUT SILK 3 0 (SUTURE)
SUT SILK 3 0 SH CR/8 (SUTURE) IMPLANT
SUT SILK 3-0 18XBRD TIE 12 (SUTURE) IMPLANT
SUT VICRYL 2 0 18  UND BR (SUTURE)
SUT VICRYL 2 0 18 UND BR (SUTURE) IMPLANT
TOWEL OR 17X26 10 PK STRL BLUE (TOWEL DISPOSABLE) ×6 IMPLANT
TRAY FOLEY W/METER SILVER 14FR (SET/KITS/TRAYS/PACK) IMPLANT
TRAY FOLEY W/METER SILVER 16FR (SET/KITS/TRAYS/PACK) IMPLANT
WATER STERILE IRR 1500ML POUR (IV SOLUTION) ×3 IMPLANT
YANKAUER SUCT BULB TIP NO VENT (SUCTIONS) IMPLANT

## 2015-08-10 NOTE — Op Note (Signed)
NAMRennie Estrada:  Stelzer, Xiao                ACCOUNT NO.:  [id number]  MEDICAL RECORD NO.:  [id number]  LOCATION:  1225                         [facility]  PHYSICIAN:  Velora Hecklerodd M Kellina Dreese, MD      DATE OF BIRTH:  [birth year]  DATE OF PROCEDURE:  08/10/2015                              OPERATIVE REPORT   PREOPERATIVE DIAGNOSIS:  Perforated viscus.  POSTOPERATIVE DIAGNOSIS:  Perforated pyloric channel ulcer.  PROCEDURE:  Exploratory laparotomy with Cheree DittoGraham patch closure of perforated pyloric channel ulcer.  SURGEON:  Velora Hecklerodd M Ellwyn Ergle, MD, FACS  ANESTHESIA:  General per Dr. Claybon Jabsaniel Singer.  ESTIMATED BLOOD LOSS:  Minimal.  PREPARATION:  ChloraPrep.  COMPLICATIONS:  None.  INDICATIONS:  The patient is a 75 year old white female, who presented to the emergency department at [facility] with a 2-3 day history of epigastric abdominal pain.  CT scan of the abdomen and pelvis demonstrated free air consistent with perforated viscus.  White blood cell count was elevated at 22,000.  The patient had been taking Voltaren for the past 3 weeks for back pain.  The [facility].  She was prepared and taken urgently to the operating room for laparotomy.  BODY OF REPORT:  Procedure was done in OR #1 at [facility].  The patient was brought to the operating room, placed in supine position on the operating room table.  Following administration general anesthesia, the patient was prepped and draped in the usual aseptic fashion.  After ascertaining that an adequate level of anesthesia had been achieved, an upper midline abdominal incision was made with a #10 blade.  Dissection was carried through subcutaneous tissues.  Fascia was incised in the midline and the peritoneal cavity was entered cautiously.  There was green fluid in the upper abdomen. There was fibrinous exudate on the liver and  gallbladder and omentum. Exploration reveals a small 4 mm punctate perforated ulcer on the anterior pyloric channel.  The omentum is mobilized so as to reach this area without tension.  Two 2-0 silk sutures are placed in the usual fashion through the bowel wall and the omental flap is placed over the perforated ulcer and the silk sutures were tied securely occluding the perforation in the fashion of a Graham patch.  Abdomen was then irrigated copiously with warm saline.  This was evacuated.  No further spillage was identified.  Good hemostasis was noted.  Nasogastric tube was properly positioned in the mid body of the stomach.  Anterior abdominal wall was closed with interrupted 0 Novafil simple sutures. Subcutaneous tissues were irrigated.  Skin was closed with stainless steel staples.  Sterile dressings were applied.  The patient was awakened from anesthesia and brought to the recovery room.  The patient tolerated the procedure well.   Velora Hecklerodd M. Khiana Camino, MD, [facility] Office: [phone]    TMG/MEDQ  D:  08/10/2015  T:  08/10/2015  Job:  [id number]  cc:   Catalina PizzaZach Hall, M.D. Fax: [phone]: [phone]  Dr. Clayborne DanaMesner

## 2015-08-10 NOTE — Brief Op Note (Signed)
08/10/2015  8:11 PM  PATIENT:  Morgan Estrada  75 y.o. female  PRE-OPERATIVE DIAGNOSIS:  perforated ulcer  POST-OPERATIVE DIAGNOSIS:  perforated ulcer  PROCEDURE:  Exploratory laparotomy with Cheree DittoGraham patch closure of perforated pyloric channel ulcer  SURGEON:  Surgeon(s) and Role:    * Darnell Levelodd Sharnell Knight, MD - Primary  ANESTHESIA:   general  EBL:  Total I/O In: 1000 [I.V.:1000] Out: 500 [Urine:450; Blood:50]  BLOOD ADMINISTERED:none  DRAINS: none   LOCAL MEDICATIONS USED:  NONE  SPECIMEN:  No Specimen  DISPOSITION OF SPECIMEN:  N/A  COUNTS:  YES  TOURNIQUET:  * No tourniquets in log *  DICTATION: .Other Dictation: Dictation Number [id number]  PLAN OF CARE: Admit to inpatient   PATIENT DISPOSITION:  ICU - extubated and stable.   Delay start of Pharmacological VTE agent (>24hrs) due to surgical blood loss or risk of bleeding: yes  Velora Hecklerodd M. Sabrinia Prien, MD, [facility] Office: [phone]

## 2015-08-10 NOTE — H&P (Signed)
Morgan Estrada is an 75 y.o. female.    [facility]  Chief Complaint: abdominal pain, perforated viscus  HPI: patient is a 75 yo WF transferred from [facility] with suspected perforated ulcer.  Patient has been taking Voltaren for 3 weeks for back pain.  Distant hx of ulcer disease.  Increased pain last 2-3 days.  At [facility] ER, WBC elevated at 22K.  CTA shows free air in the upper abdomen with inflammatory changes.  Suspect perforated distal stomach per radiologist.  Previous appendectomy and hysterectomy.  Hx of COPD.  History reviewed. No pertinent past medical history.  Past Surgical History  Procedure Laterality Date  . Appendectomy    . Abdominal hysterectomy      History reviewed. No pertinent family history. Social History:  reports that she has been smoking Cigarettes.  She has been smoking about 1.00 pack per day. She does not have any smokeless tobacco history on file. She reports that she does not drink alcohol or use illicit drugs.  Allergies: No Known Allergies  Medications Prior to Admission  Medication Sig Dispense Refill  . diclofenac (VOLTAREN) 75 MG EC tablet Take 75 mg by mouth 2 (two) times daily as needed for mild pain.    Marland Kitchen aspirin 325 MG tablet Take 325 mg by mouth daily.    Marland Kitchen HYDROcodone-acetaminophen (NORCO/VICODIN) 5-325 MG per tablet 1 tab PO q12 hours prn pain (Patient not taking: Reported on 08/10/2015) 10 tablet 0  . ondansetron (ZOFRAN) 4 MG tablet Take 1 tablet (4 mg total) by mouth every 8 (eight) hours as needed for nausea or vomiting. (Patient not taking: Reported on 08/10/2015) 6 tablet 0    Results for orders placed or performed during the Estrada encounter of 08/10/15 (from the past 48 hour(s))  CBC with Differential     Status: Abnormal   Collection Time: 08/10/15  1:42 PM  Result Value Ref Range   WBC 22.0 (H) 4.0 - 10.5 K/uL   RBC 4.82 3.87 - 5.11 MIL/uL   Hemoglobin 13.8 12.0 - 15.0 g/dL   HCT 42.0 36.0 -  46.0 %   MCV 87.1 78.0 - 100.0 fL   MCH 28.6 26.0 - 34.0 pg   MCHC 32.9 30.0 - 36.0 g/dL   RDW 14.2 11.5 - 15.5 %   Platelets 219 150 - 400 K/uL   Neutrophils Relative % 89 %   Neutro Abs 19.6 (H) 1.7 - 7.7 K/uL   Lymphocytes Relative 7 %   Lymphs Abs 1.5 0.7 - 4.0 K/uL   Monocytes Relative 4 %   Monocytes Absolute 0.9 0.1 - 1.0 K/uL   Eosinophils Relative 0 %   Eosinophils Absolute 0.0 0.0 - 0.7 K/uL   Basophils Relative 0 %   Basophils Absolute 0.0 0.0 - 0.1 K/uL  Basic metabolic panel     Status: Abnormal   Collection Time: 08/10/15  1:42 PM  Result Value Ref Range   Sodium 135 135 - 145 mmol/L   Potassium 4.4 3.5 - 5.1 mmol/L   Chloride 100 (L) 101 - 111 mmol/L   CO2 25 22 - 32 mmol/L   Glucose, Bld 131 (H) 65 - 99 mg/dL   BUN 13 6 - 20 mg/dL   Creatinine, Ser 0.98 0.44 - 1.00 mg/dL   Calcium 9.0 8.9 - 10.3 mg/dL   GFR calc non Af Amer 56 (L) >60 mL/min   GFR calc Af Amer >60 >60 mL/min    Comment: (NOTE)  The eGFR has been calculated using the CKD EPI equation. This calculation has not been validated in all clinical situations. eGFR's persistently <60 mL/min signify possible Chronic Kidney Disease.    Anion gap 10 5 - 15  Lipase, blood     Status: None   Collection Time: 08/10/15  1:42 PM  Result Value Ref Range   Lipase 24 11 - 51 U/L  Hepatic function panel     Status: Abnormal   Collection Time: 08/10/15  1:42 PM  Result Value Ref Range   Total Protein 7.2 6.5 - 8.1 g/dL   Albumin 3.7 3.5 - 5.0 g/dL   AST 19 15 - 41 U/L   ALT 13 (L) 14 - 54 U/L   Alkaline Phosphatase 82 38 - 126 U/L   Total Bilirubin 0.7 0.3 - 1.2 mg/dL   Bilirubin, Direct 0.2 0.1 - 0.5 mg/dL   Indirect Bilirubin 0.5 0.3 - 0.9 mg/dL  Lactic acid, plasma     Status: None   Collection Time: 08/10/15  1:42 PM  Result Value Ref Range   Lactic Acid, Venous 2.0 0.5 - 2.0 mmol/L  Urinalysis, Routine w reflex microscopic (not at [facility])     Status: Abnormal   Collection Time: 08/10/15  3:13 PM   Result Value Ref Range   Color, Urine YELLOW YELLOW   APPearance CLEAR CLEAR   Specific Gravity, Urine 1.010 1.005 - 1.030   pH 7.5 5.0 - 8.0   Glucose, UA NEGATIVE NEGATIVE mg/dL   Hgb urine dipstick NEGATIVE NEGATIVE   Bilirubin Urine NEGATIVE NEGATIVE   Ketones, ur NEGATIVE NEGATIVE mg/dL   Protein, ur NEGATIVE NEGATIVE mg/dL   Nitrite NEGATIVE NEGATIVE   Leukocytes, UA TRACE (A) NEGATIVE  Urine microscopic-add on     Status: Abnormal   Collection Time: 08/10/15  3:13 PM  Result Value Ref Range   Squamous Epithelial / LPF 0-5 (A) NONE SEEN   WBC, UA 0-5 0 - 5 WBC/hpf   RBC / HPF 0-5 0 - 5 RBC/hpf   Bacteria, UA FEW (A) NONE SEEN  Lactic acid, plasma     Status: None   Collection Time: 08/10/15  4:58 PM  Result Value Ref Range   Lactic Acid, Venous 1.3 0.5 - 2.0 mmol/L   Dg Chest 2 View  08/10/2015  CLINICAL DATA:  Abdominal pain, worse in the right upper quadrant. EXAM: CHEST  2 VIEW COMPARISON:  07/22/2012. FINDINGS: Normal sized heart. Clear lungs. Mildly prominent interstitial markings. Thoracic spine degenerative changes. Diffuse osteopenia. Moderate bilateral inferior acromioclavicular spur formation. IMPRESSION: Mild chronic interstitial lung disease.  No acute abnormality. Electronically Signed   By: Claudie Revering M.D.   On: 08/10/2015 16:30   Ct Abdomen Pelvis W Contrast  08/10/2015  CLINICAL DATA:  75 year old female with history of severe right upper quadrant abdominal pain. EXAM: CT ABDOMEN AND PELVIS WITH CONTRAST TECHNIQUE: Multidetector CT imaging of the abdomen and pelvis was performed using the standard protocol following bolus administration of intravenous contrast. CONTRAST:  167m ISOVUE-300 IOPAMIDOL (ISOVUE-300) INJECTION 61% COMPARISON:  CT the abdomen and pelvis 12/16/2014. FINDINGS: Lower chest:  Unremarkable. Hepatobiliary: In segment 3 of the liver there is a 1.2 cm low-attenuation lesion compatible with a simple cyst. No other suspicious hepatic lesions are  noted. No intra or extrahepatic biliary ductal dilatation. Gallbladder is normal in appearance. Pancreas: No pancreatic mass. No pancreatic ductal dilatation. No pancreatic or peripancreatic fluid or inflammatory changes. Spleen: Unremarkable. Adrenals/Urinary Tract: Exophytic 7.5 cm low-attenuation lesion  in the interpolar region of the left kidney is compatible with a large simple cyst. Right kidney and bilateral adrenal glands are normal in appearance. No hydroureteronephrosis. Urinary bladder is normal in appearance. Stomach/Bowel: Although the stomach generally is normal in appearance, there is what appears to be a small perforation in the anterior aspect of the body of the stomach shortly before the antrum (image 18 of series 2 and sagittal image 56 of series 5), which is contiguous with adjacent pneumoperitoneum. No pathologic dilatation of small bowel or colon. Colonic diverticulae are present, most evident in the sigmoid colon, without surrounding inflammatory changes to suggest an acute diverticulitis at this time. There are some inflammatory changes in the right upper quadrant in close proximity to the duodenum, hepatic flexure of the colon and gallbladder, the source of which is uncertain. The appendix is not confidently identified may be surgically absent. Regardless, there are no inflammatory changes noted adjacent to the cecum to suggest the presence of an acute appendicitis at this time. Vascular/Lymphatic: Extensive atherosclerosis throughout the abdominal and pelvic vasculature, including fusiform aneurysmal dilatation of the infrarenal abdominal aorta which measures up to 3.8 x 3.5 cm. No lymphadenopathy noted in the abdomen or pelvis. Reproductive: Status post hysterectomy. Ovaries are not confidently identified may be surgically absent or atrophic. Other: Small volume of pneumoperitoneum predominantly in the upper abdomen, including a small amount of gas in the lesser sac. Small volume of  ascites. Inflammatory changes in the right upper quadrant of the abdomen, as above. Musculoskeletal: There are no aggressive appearing lytic or blastic lesions noted in the visualized portions of the skeleton. Chronic appearing compression fracture of superior endplate of L3 with approximately 15% loss of anterior vertebral body height. IMPRESSION: 1. Study is positive for pneumoperitoneum. There is evidence of possible gastric perforation in the anterior aspect of the gastric body. Surgical consultation is recommended. 2. In addition, there are inflammatory changes in the right upper quadrant of the abdomen in close proximity to the duodenum, gallbladder and hepatic flexure of the colon. It is uncertain whether or not any of these structures are related to these inflammatory changes, or these inflammatory changes are simply secondary to a chemical peritonitis developing in the setting of a gastric perforation. 3. Atherosclerosis, including fusiform aneurysmal dilatation of the infrarenal abdominal aorta which measures up to 3.8 x 3.5 cm. Recommend followup by ultrasound in 2 years. This recommendation follows ACR consensus guidelines: White Paper of the ACR Incidental Findings Committee II on Vascular Findings. J Am Coll Radiol 2013; 10:789-794. 4. Small volume of ascites. 5. Additional incidental findings, as above. Critical Value/emergent results were called by telephone at the time of interpretation on 08/10/2015 at 4:25 pm to Dr. Merrily Pew, who verbally acknowledged these results. Electronically Signed   By: Vinnie Langton M.D.   On: 08/10/2015 16:26    Review of Systems  Constitutional: Negative.   HENT: Negative.   Eyes: Negative.   Respiratory: Negative.   Cardiovascular: Negative.   Gastrointestinal: Positive for abdominal pain (diffuse).  Genitourinary: Negative.   Musculoskeletal: Positive for back pain.  Skin: Negative.   Neurological: Negative.   Endo/Heme/Allergies: Negative.    Psychiatric/Behavioral: Negative.     Blood pressure 134/57, pulse 86, temperature 98.6 F (37 C), temperature source Oral, resp. rate 20, height _0  (1.626 m), weight 70.308 kg (155 lb), SpO2 99 %. Physical Exam  Constitutional: She is oriented to person, place, and time. She appears well-developed and well-nourished. She appears distressed.  HENT:  Head: Normocephalic and atraumatic.  Right Ear: External ear normal.  Left Ear: External ear normal.  Eyes: Conjunctivae are normal. Pupils are equal, round, and reactive to light. No scleral icterus.  Neck: Normal range of motion. Neck supple. No tracheal deviation present. No thyromegaly present.  Cardiovascular: Normal rate, regular rhythm and normal heart sounds.   No murmur heard. Respiratory: Effort normal and breath sounds normal. No respiratory distress. She has no wheezes.  GI: She exhibits no distension and no mass. There is tenderness (diffuse severe tenderness). There is rebound and guarding.  Musculoskeletal: Normal range of motion. She exhibits no edema or tenderness.  Neurological: She is alert and oriented to person, place, and time.  Skin: Skin is warm and dry. She is not diaphoretic.  Psychiatric: She has a normal mood and affect. Her behavior is normal.     Assessment/Plan Perforated viscus - suspect perforated gastric ulcer  Patient delivered by [facility] directly to [place] holding area  Family at bedside  Suspect perforated gastric ulcer  Plan urgent lapartomy with Phillip Heal patch closure of perforated ulcer  Zosyn started at [facility]  The risks and benefits of the procedure have been discussed at length with the patient.  The patient understands the proposed procedure, potential alternative treatments, and the course of recovery to be expected.  All of the patient's questions have been answered at this time.  The patient wishes to proceed with surgery.  Earnstine Regal, MD, [facility] Office: [place], MD 08/10/2015, 6:50 PM

## 2015-08-10 NOTE — Transfer of Care (Signed)
Immediate Anesthesia Transfer of Care Note  Patient: Morgan Estrada  Procedure(s) Performed: Procedure(s): EXPLORATORY LAPAROTOMY (N/A)  Patient Location: PACU  Anesthesia Type:General  Level of Consciousness:  sedated, patient cooperative and responds to stimulation  Airway & Oxygen Therapy:Patient Spontanous Breathing and Patient connected to face mask oxgen  Post-op Assessment:  Report given to PACU RN and Post -op Vital signs reviewed and stable  Post vital signs:  Reviewed and stable  Last Vitals:  Filed Vitals:   08/10/15 1645 08/10/15 1700  BP:  134/57  Pulse: 85 86  Temp:    Resp: 19 20    Complications: No apparent anesthesia complications

## 2015-08-10 NOTE — ED Notes (Signed)
[facility] here for pt.  

## 2015-08-10 NOTE — Anesthesia Postprocedure Evaluation (Signed)
Anesthesia Post Note  Patient: [name]  Procedure(s) Performed: Procedure(s) (LRB): EXPLORATORY LAPAROTOMY (N/A)  Patient location during evaluation: PACU Anesthesia Type: General Level of consciousness: sedated Pain management: pain level controlled Vital Signs Assessment: post-procedure vital signs reviewed and stable Respiratory status: spontaneous breathing and respiratory function stable Cardiovascular status: stable Anesthetic complications: no    Last Vitals:  Filed Vitals:   08/10/15 2045 08/10/15 2100  BP: 179/70 189/75  Pulse: 86 88  Temp:    Resp: 10 8    Last Pain:  Filed Vitals:   08/10/15 2105  PainSc: Asleep                 Irene Collings DANIEL

## 2015-08-10 NOTE — ED Notes (Signed)
C/o abdominal pain, rates pain 8/10.

## 2015-08-10 NOTE — ED Provider Notes (Signed)
CSN: [id number]     Arrival date & time 08/10/15  1323 History   First MD Initiated Contact with Patient 08/10/15 1339     Chief Complaint  Patient presents with  . Abdominal Pain     (Consider location/radiation/quality/duration/timing/severity/associated sxs/prior Treatment) Patient is a 75 y.o. female presenting with abdominal pain.  Abdominal Pain Pain location:  Generalized Pain quality: aching and sharp   Pain radiates to:  Does not radiate Pain severity:  Moderate Onset quality:  Gradual Duration:  2 days Timing:  Constant Progression:  Worsening Chronicity:  Recurrent Context: previous surgery   Context: not alcohol use   Associated symptoms: cough and shortness of breath   Associated symptoms: no chest pain, no chills, no constipation, no diarrhea, no fever, no nausea and no vomiting     History reviewed. No pertinent past medical history. Past Surgical History  Procedure Laterality Date  . Appendectomy    . Abdominal hysterectomy     No family history on file. Social History  Substance Use Topics  . Smoking status: Current Every Day Smoker -- 1.00 packs/day    Types: Cigarettes  . Smokeless tobacco: None  . Alcohol Use: No   OB History    No data available     Review of Systems  Constitutional: Negative for fever and chills.  Eyes: Negative for pain.  Respiratory: Positive for cough and shortness of breath. Negative for chest tightness.   Cardiovascular: Negative for chest pain.  Gastrointestinal: Positive for abdominal pain. Negative for nausea, vomiting, diarrhea and constipation.  Endocrine: Negative for polydipsia and polyuria.  Musculoskeletal: Negative for back pain, arthralgias and gait problem.  All other systems reviewed and are negative.     Allergies  Review of patient's allergies indicates no known allergies.  Home Medications   Prior to Admission medications   Medication Sig Start Date End Date Taking? Authorizing Provider   diclofenac (VOLTAREN) 75 MG EC tablet Take 75 mg by mouth 2 (two) times daily as needed for mild pain.   Yes Historical Provider, MD  aspirin 325 MG tablet Take 325 mg by mouth daily.    Historical Provider, MD  HYDROcodone-acetaminophen (NORCO/VICODIN) 5-325 MG per tablet 1 tab PO q12 hours prn pain Patient not taking: Reported on 08/10/2015 12/16/14   Samuel Jester, DO  ondansetron (ZOFRAN) 4 MG tablet Take 1 tablet (4 mg total) by mouth every 8 (eight) hours as needed for nausea or vomiting. Patient not taking: Reported on 08/10/2015 12/16/14   Samuel Jester, DO   BP 134/57 mmHg  Pulse 86  Temp(Src) 98.6 F (37 C) (Oral)  Resp 20  Ht  (1.626 m)  Wt 155 lb (70.308 kg)  BMI 26.59 kg/m2  SpO2 99% Physical Exam  Constitutional: She is oriented to person, place, and time. She appears well-developed and well-nourished.  HENT:  Head: Normocephalic and atraumatic.  Neck: Normal range of motion.  Cardiovascular: Normal rate and regular rhythm.   Pulmonary/Chest: No stridor. No respiratory distress.  Abdominal: She exhibits no distension. There is tenderness. There is no rebound and no guarding.  Musculoskeletal: Normal range of motion. She exhibits no edema or tenderness.  Neurological: She is alert and oriented to person, place, and time. No cranial nerve deficit. Coordination normal.  Skin: Skin is warm and dry.  Nursing note and vitals reviewed.   ED Course  Procedures (including critical care time)  CRITICAL CARE Performed by: Marily Memos  Total critical care time: 35 minutes Critical care time  was exclusive of separately billable procedures and treating other patients. Critical care was necessary to treat or prevent imminent or life-threatening deterioration. Critical care was time spent personally by me on the following activities: development of treatment plan with patient and/or surrogate as well as nursing, discussions with consultants, evaluation of patient's  response to treatment, examination of patient, obtaining history from patient or surrogate, ordering and performing treatments and interventions, ordering and review of laboratory studies, ordering and review of radiographic studies, pulse oximetry and re-evaluation of patient's condition.   Labs Review Labs Reviewed  CBC WITH DIFFERENTIAL/PLATELET - Abnormal; Notable for the following:    WBC 22.0 (*)    Neutro Abs 19.6 (*)    All other components within normal limits  BASIC METABOLIC PANEL - Abnormal; Notable for the following:    Chloride 100 (*)    Glucose, Bld 131 (*)    GFR calc non Af Amer 56 (*)    All other components within normal limits  HEPATIC FUNCTION PANEL - Abnormal; Notable for the following:    ALT 13 (*)    All other components within normal limits  URINALYSIS, ROUTINE W REFLEX MICROSCOPIC (NOT AT [facility]) - Abnormal; Notable for the following:    Leukocytes, UA TRACE (*)    All other components within normal limits  URINE MICROSCOPIC-ADD ON - Abnormal; Notable for the following:    Squamous Epithelial / LPF 0-5 (*)    Bacteria, UA FEW (*)    All other components within normal limits  LIPASE, BLOOD  LACTIC ACID, PLASMA  LACTIC ACID, PLASMA    Imaging Review Dg Chest 2 View  08/10/2015  CLINICAL DATA:  Abdominal pain, worse in the right upper quadrant. EXAM: CHEST  2 VIEW COMPARISON:  07/22/2012. FINDINGS: Normal sized heart. Clear lungs. Mildly prominent interstitial markings. Thoracic spine degenerative changes. Diffuse osteopenia. Moderate bilateral inferior acromioclavicular spur formation. IMPRESSION: Mild chronic interstitial lung disease.  No acute abnormality. Electronically Signed   By: Beckie Salts M.D.   On: 08/10/2015 16:30   Ct Abdomen Pelvis W Contrast  08/10/2015  CLINICAL DATA:  75 year old female with history of severe right upper quadrant abdominal pain. EXAM: CT ABDOMEN AND PELVIS WITH CONTRAST TECHNIQUE: Multidetector CT imaging of the abdomen and  pelvis was performed using the standard protocol following bolus administration of intravenous contrast. CONTRAST:  ISOVUE-300 IOPAMIDOL (ISOVUE-300) INJECTION 61% COMPARISON:  CT the abdomen and pelvis 12/16/2014. FINDINGS: Lower chest:  Unremarkable. Hepatobiliary: In segment 3 of the liver there is a 1.2 cm low-attenuation lesion compatible with a simple cyst. No other suspicious hepatic lesions are noted. No intra or extrahepatic biliary ductal dilatation. Gallbladder is normal in appearance. Pancreas: No pancreatic mass. No pancreatic ductal dilatation. No pancreatic or peripancreatic fluid or inflammatory changes. Spleen: Unremarkable. Adrenals/Urinary Tract: Exophytic 7.5 cm low-attenuation lesion in the interpolar region of the left kidney is compatible with a large simple cyst. Right kidney and bilateral adrenal glands are normal in appearance. No hydroureteronephrosis. Urinary bladder is normal in appearance. Stomach/Bowel: Although the stomach generally is normal in appearance, there is what appears to be a small perforation in the anterior aspect of the body of the stomach shortly before the antrum (image 18 of series 2 and sagittal image 56 of series 5), which is contiguous with adjacent pneumoperitoneum. No pathologic dilatation of small bowel or colon. Colonic diverticulae are present, most evident in the sigmoid colon, without surrounding inflammatory changes to suggest an acute diverticulitis at this time. There  are some inflammatory changes in the right upper quadrant in close proximity to the duodenum, hepatic flexure of the colon and gallbladder, the source of which is uncertain. The appendix is not confidently identified may be surgically absent. Regardless, there are no inflammatory changes noted adjacent to the cecum to suggest the presence of an acute appendicitis at this time. Vascular/Lymphatic: Extensive atherosclerosis throughout the abdominal and pelvic vasculature, including  fusiform aneurysmal dilatation of the infrarenal abdominal aorta which measures up to 3.8 x 3.5 cm. No lymphadenopathy noted in the abdomen or pelvis. Reproductive: Status post hysterectomy. Ovaries are not confidently identified may be surgically absent or atrophic. Other: Small volume of pneumoperitoneum predominantly in the upper abdomen, including a small amount of gas in the lesser sac. Small volume of ascites. Inflammatory changes in the right upper quadrant of the abdomen, as above. Musculoskeletal: There are no aggressive appearing lytic or blastic lesions noted in the visualized portions of the skeleton. Chronic appearing compression fracture of superior endplate of L3 with approximately 15% loss of anterior vertebral body height. IMPRESSION: 1. Study is positive for pneumoperitoneum. There is evidence of possible gastric perforation in the anterior aspect of the gastric body. Surgical consultation is recommended. 2. In addition, there are inflammatory changes in the right upper quadrant of the abdomen in close proximity to the duodenum, gallbladder and hepatic flexure of the colon. It is uncertain whether or not any of these structures are related to these inflammatory changes, or these inflammatory changes are simply secondary to a chemical peritonitis developing in the setting of a gastric perforation. 3. Atherosclerosis, including fusiform aneurysmal dilatation of the infrarenal abdominal aorta which measures up to 3.8 x 3.5 cm. Recommend followup by ultrasound in 2 years. This recommendation follows ACR consensus guidelines: White Paper of the ACR Incidental Findings Committee II on Vascular Findings. J Am Coll Radiol 2013; 10:789-794. 4. Small volume of ascites. 5. Additional incidental findings, as above. Critical Value/emergent results were called by telephone at the time of interpretation on 08/10/2015 at 4:25 pm to Dr. Marily MemosJASON Aamya Orellana, who verbally acknowledged these results. Electronically Signed    By: Trudie Reedaniel  Entrikin M.D.   On: 08/10/2015 16:26   I have personally reviewed and evaluated these images and lab results as part of my medical decision-making.   EKG Interpretation None      MDM   Final diagnoses:  Peritonitis (HCC)  Pneumoperitoneum   Peritonitis with generalized abdominal pain. Diverticulitis, colitis, cholecystits? No n/v. Also with h/o AAA, maybe getting larger? Will check labs and ct.  Leukocytosis. Ct with e/o free air, likely gastric ulcer perforation. Zosyn started, fluids given. D/[facility] Dr[facility] Gerrit FriendsGerkin at [facility].     Marily MemosJason Cindie Rajagopalan, MD 08/10/15 [phone]

## 2015-08-10 NOTE — Anesthesia Procedure Notes (Signed)
Procedure Name: Intubation Date/Time: 08/10/2015 7:15 PM Performed by: Keeyon Privitera, Nuala AlphaKRISTOPHER Pre-anesthesia Checklist: Patient identified, Emergency Drugs available, Suction available, Patient being monitored and Timeout performed Patient Re-evaluated:Patient Re-evaluated prior to inductionOxygen Delivery Method: Circle system utilized Preoxygenation: Pre-oxygenation with 100% oxygen Intubation Type: IV induction and Rapid sequence Laryngoscope Size: Mac and 3 Grade View: Grade I Tube type: Oral Tube size: 7.5 mm Number of attempts: 1 Airway Equipment and Method: Stylet Placement Confirmation: ETT inserted through vocal cords under direct vision,  positive ETCO2,  CO2 detector and breath sounds checked- equal and bilateral Secured at: 21 cm Tube secured with: Tape Dental Injury: Teeth and Oropharynx as per pre-operative assessment

## 2015-08-10 NOTE — Anesthesia Preprocedure Evaluation (Addendum)
Anesthesia Evaluation  Patient identified by MRN, date of birth, ID band Patient awake    Reviewed: Allergy & Precautions, NPO status , Patient's Chart, lab work & pertinent test results  History of Anesthesia Complications Negative for: history of anesthetic complications  Airway Mallampati: I  TM Distance: >3 FB Neck ROM: Full    Dental  (+) Dental Advisory Given, Edentulous Upper, Edentulous Lower   Pulmonary Current Smoker,    Pulmonary exam normal        Cardiovascular negative cardio ROS Normal cardiovascular exam     Neuro/Psych    GI/Hepatic Neg liver ROS,   Endo/Other  negative endocrine ROS  Renal/GU negative Renal ROS     Musculoskeletal   Abdominal   Peds  Hematology   Anesthesia Other Findings   Reproductive/Obstetrics                           Anesthesia Physical Anesthesia Plan  ASA: III and emergent  Anesthesia Plan: General   Post-op Pain Management:    Induction: Intravenous, Rapid sequence and Cricoid pressure planned  Airway Management Planned: Oral ETT  Additional Equipment:   Intra-op Plan:   Post-operative Plan: Extubation in OR  Informed Consent: I have reviewed the patients History and Physical, chart, labs and discussed the procedure including the risks, benefits and alternatives for the proposed anesthesia with the patient or authorized representative who has indicated his/her understanding and acceptance.   Dental advisory given  Plan Discussed with: CRNA, Anesthesiologist and Surgeon  Anesthesia Plan Comments:        Anesthesia Quick Evaluation

## 2015-08-11 ENCOUNTER — Inpatient Hospital Stay (HOSPITAL_COMMUNITY): Payer: Medicare Other

## 2015-08-11 LAB — CBC
HCT: 37.4 % (ref 36.0–46.0)
Hemoglobin: 12.5 g/dL (ref 12.0–15.0)
MCH: 28.6 pg (ref 26.0–34.0)
MCHC: 33.4 g/dL (ref 30.0–36.0)
MCV: 85.6 fL (ref 78.0–100.0)
PLATELETS: 189 10*3/uL (ref 150–400)
RBC: 4.37 MIL/uL (ref 3.87–5.11)
RDW: 14.6 % (ref 11.5–15.5)
WBC: 20 10*3/uL — AB (ref 4.0–10.5)

## 2015-08-11 LAB — GLUCOSE, CAPILLARY: Glucose-Capillary: 152 mg/dL — ABNORMAL HIGH (ref 65–99)

## 2015-08-11 LAB — BASIC METABOLIC PANEL
Anion gap: 7 (ref 5–15)
BUN: 12 mg/dL (ref 6–20)
CHLORIDE: 107 mmol/L (ref 101–111)
CO2: 23 mmol/L (ref 22–32)
CREATININE: 0.86 mg/dL (ref 0.44–1.00)
Calcium: 8.5 mg/dL — ABNORMAL LOW (ref 8.9–10.3)
Glucose, Bld: 165 mg/dL — ABNORMAL HIGH (ref 65–99)
POTASSIUM: 4.4 mmol/L (ref 3.5–5.1)
SODIUM: 137 mmol/L (ref 135–145)

## 2015-08-11 NOTE — Progress Notes (Signed)
Pharmacy Antibiotic Note  Morgan Estrada is a 75 y.o. female admitted on 08/10/2015 with Peritonitis.  Pharmacy has been consulted for Zosyn dosing.  Plan: Zosyn 3.375g IV q8h (4 hour infusion).  Height: 5\' 4"  (162.6 cm) Weight: 155 lb (70.308 kg) IBW/kg (Calculated) : 54.7  Temp (24hrs), Avg:98.4 F (36.9 C), Min:97.6 F (36.4 C), Max:98.8 F (37.1 C)   Recent Labs Lab 08/10/15 1342 08/10/15 1658 08/10/15 2217 08/11/15 0328  WBC 22.0*  --  22.8* 20.0*  CREATININE 0.98  --  0.86 0.86  LATICACIDVEN 2.0 1.3  --   --     Estimated Creatinine Clearance: 56.8 mL/min (by C-G formula based on Cr of 0.86).    No Known Allergies  Antimicrobials this admission: Zosyn 4/15 >>  Dose adjustments this admission: -  Microbiology results: Pending  Thank you for allowing pharmacy to be a part of this patient's care.  Morgan Estrada, Morgan Estrada 08/11/2015 6:14 AM

## 2015-08-11 NOTE — Progress Notes (Signed)
1 Day Post-Op  Subjective: PT FEELS BETTER SORE ABDOMEN   Objective: Vital signs in last 24 hours: Temp:  [97.6 F (36.4 C)-98.8 F (37.1 C)] 98.5 F (36.9 C) (04/16 0400) Pulse Rate:  [68-90] 70 (04/16 0700) Resp:  [7-20] 8 (04/16 0700) BP: (112-201)/(53-127) 158/72 mmHg (04/16 0700) SpO2:  [96 %-100 %] 99 % (04/16 0700) Weight:  [70.308 kg (155 lb)] 70.308 kg (155 lb) (04/15 1328) Last BM Date: 08/10/15  Intake/Output from previous day: 04/15 0701 - 04/16 0700 In: 2513.3 [I.V.:2373.3; IV Piggyback:100] Out: 1075 [Urine:975; Emesis/NG output:50; Blood:50] Intake/Output this shift:    Incision/Wound:CDI  Soft  Sore around incision  Lab Results:   Recent Labs  08/10/15 2217 08/11/15 0328  WBC 22.8* 20.0*  HGB 13.5 12.5  HCT 39.5 37.4  PLT 189 189   BMET  Recent Labs  08/10/15 1342 08/10/15 2217 08/11/15 0328  NA 135  --  137  K 4.4  --  4.4  CL 100*  --  107  CO2 25  --  23  GLUCOSE 131*  --  165*  BUN 13  --  12  CREATININE 0.98 0.86 0.86  CALCIUM 9.0  --  8.5*   PT/INR No results for input(s): LABPROT, INR in the last 72 hours. ABG No results for input(s): PHART, HCO3 in the last 72 hours.  Invalid input(s): PCO2, PO2  Studies/Results: Dg Chest 2 View  08/10/2015  CLINICAL DATA:  Abdominal pain, worse in the right upper quadrant. EXAM: CHEST  2 VIEW COMPARISON:  07/22/2012. FINDINGS: Normal sized heart. Clear lungs. Mildly prominent interstitial markings. Thoracic spine degenerative changes. Diffuse osteopenia. Moderate bilateral inferior acromioclavicular spur formation. IMPRESSION: Mild chronic interstitial lung disease.  No acute abnormality. Electronically Signed   By: Beckie SaltsSteven  Reid M.D.   On: 08/10/2015 16:30   Ct Abdomen Pelvis W Contrast  08/10/2015  CLINICAL DATA:  75 year old female with history of severe right upper quadrant abdominal pain. EXAM: CT ABDOMEN AND PELVIS WITH CONTRAST TECHNIQUE: Multidetector CT imaging of the abdomen and pelvis  was performed using the standard protocol following bolus administration of intravenous contrast. CONTRAST:  100mL ISOVUE-300 IOPAMIDOL (ISOVUE-300) INJECTION 61% COMPARISON:  CT the abdomen and pelvis 12/16/2014. FINDINGS: Lower chest:  Unremarkable. Hepatobiliary: In segment 3 of the liver there is a 1.2 cm low-attenuation lesion compatible with a simple cyst. No other suspicious hepatic lesions are noted. No intra or extrahepatic biliary ductal dilatation. Gallbladder is normal in appearance. Pancreas: No pancreatic mass. No pancreatic ductal dilatation. No pancreatic or peripancreatic fluid or inflammatory changes. Spleen: Unremarkable. Adrenals/Urinary Tract: Exophytic 7.5 cm low-attenuation lesion in the interpolar region of the left kidney is compatible with a large simple cyst. Right kidney and bilateral adrenal glands are normal in appearance. No hydroureteronephrosis. Urinary bladder is normal in appearance. Stomach/Bowel: Although the stomach generally is normal in appearance, there is what appears to be a small perforation in the anterior aspect of the body of the stomach shortly before the antrum (image 18 of series 2 and sagittal image 56 of series 5), which is contiguous with adjacent pneumoperitoneum. No pathologic dilatation of small bowel or colon. Colonic diverticulae are present, most evident in the sigmoid colon, without surrounding inflammatory changes to suggest an acute diverticulitis at this time. There are some inflammatory changes in the right upper quadrant in close proximity to the duodenum, hepatic flexure of the colon and gallbladder, the source of which is uncertain. The appendix is not confidently identified may be surgically  absent. Regardless, there are no inflammatory changes noted adjacent to the cecum to suggest the presence of an acute appendicitis at this time. Vascular/Lymphatic: Extensive atherosclerosis throughout the abdominal and pelvic vasculature, including fusiform  aneurysmal dilatation of the infrarenal abdominal aorta which measures up to 3.8 x 3.5 cm. No lymphadenopathy noted in the abdomen or pelvis. Reproductive: Status post hysterectomy. Ovaries are not confidently identified may be surgically absent or atrophic. Other: Small volume of pneumoperitoneum predominantly in the upper abdomen, including a small amount of gas in the lesser sac. Small volume of ascites. Inflammatory changes in the right upper quadrant of the abdomen, as above. Musculoskeletal: There are no aggressive appearing lytic or blastic lesions noted in the visualized portions of the skeleton. Chronic appearing compression fracture of superior endplate of L3 with approximately 15% loss of anterior vertebral body height. IMPRESSION: 1. Study is positive for pneumoperitoneum. There is evidence of possible gastric perforation in the anterior aspect of the gastric body. Surgical consultation is recommended. 2. In addition, there are inflammatory changes in the right upper quadrant of the abdomen in close proximity to the duodenum, gallbladder and hepatic flexure of the colon. It is uncertain whether or not any of these structures are related to these inflammatory changes, or these inflammatory changes are simply secondary to a chemical peritonitis developing in the setting of a gastric perforation. 3. Atherosclerosis, including fusiform aneurysmal dilatation of the infrarenal abdominal aorta which measures up to 3.8 x 3.5 cm. Recommend followup by ultrasound in 2 years. This recommendation follows ACR consensus guidelines: White Paper of the ACR Incidental Findings Committee II on Vascular Findings. J Am Coll Radiol 2013; 10:789-794. 4. Small volume of ascites. 5. Additional incidental findings, as above. Critical Value/emergent results were called by telephone at the time of interpretation on 08/10/2015 at 4:25 pm to Dr. Marily Memos, who verbally acknowledged these results. Electronically Signed   By: Trudie Reed M.D.   On: 08/10/2015 16:26   Dg Chest Port 1 View  08/11/2015  CLINICAL DATA:  Perforated ulcer.  Smoker. EXAM: PORTABLE CHEST 1 VIEW COMPARISON:  Yesterday FINDINGS: Normal sized heart. Prominent interstitial markings. Mild bibasilar atelectasis. Poor inspiration. Nasogastric tube extending into the stomach. Diffuse osteopenia. IMPRESSION: 1. Poor inspiration with mild bibasilar atelectasis. 2. Stable chronic interstitial lung disease. Electronically Signed   By: Beckie Salts M.D.   On: 08/11/2015 07:10    Anti-infectives: Anti-infectives    Start     Dose/Rate Route Frequency Ordered Stop   08/10/15 2215  piperacillin-tazobactam (ZOSYN) IVPB 3.375 g     3.375 g 12.5 mL/hr over 240 Minutes Intravenous 3 times per day 08/10/15 2210     08/10/15 1630  piperacillin-tazobactam (ZOSYN) IVPB 3.375 g     3.375 g 100 mL/hr over 30 Minutes Intravenous  Once 08/10/15 1625 08/10/15 1714      Assessment/Plan: s/p Procedure(s): EXPLORATORY LAPAROTOMY (N/A) for perforated duodenal  Ulcer  To floor OOB Continue NGT CONTINUE PROTONIX   LOS: 1 day    Morgan Estrada A. 08/11/2015

## 2015-08-12 ENCOUNTER — Encounter (HOSPITAL_COMMUNITY): Payer: Self-pay | Admitting: Surgery

## 2015-08-12 MED ORDER — NICOTINE 7 MG/24HR TD PT24
7.0000 mg | MEDICATED_PATCH | Freq: Every day | TRANSDERMAL | Status: DC
  Administered 2015-08-12 – 2015-08-18 (×7): 7 mg via TRANSDERMAL
  Filled 2015-08-12 (×7): qty 1

## 2015-08-12 MED ORDER — ACETAMINOPHEN 10 MG/ML IV SOLN
1000.0000 mg | Freq: Four times a day (QID) | INTRAVENOUS | Status: AC
  Administered 2015-08-12 – 2015-08-13 (×4): 1000 mg via INTRAVENOUS
  Filled 2015-08-12 (×5): qty 100

## 2015-08-12 MED ORDER — KCL IN DEXTROSE-NACL 20-5-0.45 MEQ/L-%-% IV SOLN
INTRAVENOUS | Status: DC
  Administered 2015-08-13 (×3): via INTRAVENOUS
  Administered 2015-08-15: 1000 mL via INTRAVENOUS
  Administered 2015-08-16 – 2015-08-17 (×3): via INTRAVENOUS
  Filled 2015-08-12 (×10): qty 1000

## 2015-08-12 MED ORDER — HYDROMORPHONE HCL 1 MG/ML IJ SOLN
0.5000 mg | INTRAMUSCULAR | Status: DC | PRN
  Administered 2015-08-14: 1 mg via INTRAVENOUS
  Administered 2015-08-14 – 2015-08-15 (×3): 0.5 mg via INTRAVENOUS
  Administered 2015-08-16: 1 mg via INTRAVENOUS
  Administered 2015-08-16 – 2015-08-17 (×2): 0.5 mg via INTRAVENOUS
  Filled 2015-08-12 (×7): qty 1

## 2015-08-12 MED ORDER — PHENOL 1.4 % MT LIQD
1.0000 | OROMUCOSAL | Status: DC | PRN
  Filled 2015-08-12: qty 177

## 2015-08-12 NOTE — Progress Notes (Signed)
Patient unable to ambulate as ordered, however has sat on side of bed for extended periods of time without difficulty

## 2015-08-12 NOTE — Progress Notes (Signed)
Dilaudid PCA D/C'd. Unable to waste in Pyxis. Wasted 7 mls in sink with Sherolyn BubaLeann Owen.

## 2015-08-12 NOTE — Progress Notes (Addendum)
Pharmacy Antibiotic Note  Morgan Estrada is a 75 y.o. female admitted on 08/10/2015 with Peritonitis.  Pharmacy has been consulted for Zosyn dosing.  Plan: Today is day #3 of Zosyn 3.375g IV q8h (4 hour infusion time). SCr has been stable.  Current dosage is appropriate and need for further dosage adjustment appears unlikely at present. Will sign off at this time.  Please reconsult if a change in clinical status warrants re-evaluation of dosage.  Zosyn 3.375g IV q8h (4 hour infusion).  Height: 5\' 4"  (162.6 cm) Weight: 155 lb (70.308 kg) IBW/kg (Calculated) : 54.7  Temp (24hrs), Avg:98.4 F (36.9 C), Min:97.7 F (36.5 C), Max:99.1 F (37.3 C)   Recent Labs Lab 08/10/15 1342 08/10/15 1658 08/10/15 2217 08/11/15 0328  WBC 22.0*  --  22.8* 20.0*  CREATININE 0.98  --  0.86 0.86  LATICACIDVEN 2.0 1.3  --   --     Estimated Creatinine Clearance: 56.8 mL/min (by C-G formula based on Cr of 0.86).    No Known Allergies  Antimicrobials this admission: Zosyn 4/15 >>  Dose adjustments this admission: -  Microbiology results: none  Thank you for allowing pharmacy to be a part of this patient's care.  Clance BollRunyon, Morgan Estrada 08/12/2015 9:15 AM

## 2015-08-12 NOTE — Progress Notes (Signed)
Dialudid PCA D/C'd. [place] to [place].  Wasted 7mls in sink with Aquilla HackerSusan Walton.

## 2015-08-12 NOTE — Progress Notes (Signed)
2 Days Post-Op  Subjective: She is very confused, doesn't know where she is or why she is here.  Says she got old.  Lives with her mother.  Not moving or doing much with IS.    Objective: Vital signs in last 24 hours: Temp:  [97.7 F (36.5 C)-99.1 F (37.3 C)] 99.1 F (37.3 C) (04/17 0449) Pulse Rate:  [70-80] 80 (04/17 0449) Resp:  [7-15] 13 (04/17 0449) BP: (135-167)/(58-82) 141/60 mmHg (04/17 0449) SpO2:  [93 %-100 %] 95 % (04/17 0449) Last BM Date: 08/10/15 NG 410 recorded 925 urine IV? Afebrile, VSS No labs this AM  Intake/Output from previous day: 04/16 0701 - 04/17 0700 In: 200 [I.V.:200] Out: 1335 [Urine:925; Emesis/NG output:410] Intake/Output this shift:    General appearance: alert, no distress and confused, she can't really do much because of her confusion and pain. Resp: alert, no distress and confused Cardio: regular, a little tachycardic GI: wound dressing mid line is fine, Waffle dressing, few BS,  NG draining, I fixed her sump  Lab Results:   Recent Labs  08/10/15 2217 08/11/15 0328  WBC 22.8* 20.0*  HGB 13.5 12.5  HCT 39.5 37.4  PLT 189 189    BMET  Recent Labs  08/10/15 1342 08/10/15 2217 08/11/15 0328  NA 135  --  137  K 4.4  --  4.4  CL 100*  --  107  CO2 25  --  23  GLUCOSE 131*  --  165*  BUN 13  --  12  CREATININE 0.98 0.86 0.86  CALCIUM 9.0  --  8.5*   PT/INR No results for input(s): LABPROT, INR in the last 72 hours.   Recent Labs Lab 08/10/15 1342  AST 19  ALT 13*  ALKPHOS 82  BILITOT 0.7  PROT 7.2  ALBUMIN 3.7     Lipase     Component Value Date/Time   LIPASE 24 08/10/2015 1342     Studies/Results: Dg Chest 2 View  08/10/2015  CLINICAL DATA:  Abdominal pain, worse in the right upper quadrant. EXAM: CHEST  2 VIEW COMPARISON:  07/22/2012. FINDINGS: Normal sized heart. Clear lungs. Mildly prominent interstitial markings. Thoracic spine degenerative changes. Diffuse osteopenia. Moderate bilateral inferior  acromioclavicular spur formation. IMPRESSION: Mild chronic interstitial lung disease.  No acute abnormality. Electronically Signed   By: Beckie SaltsSteven  Reid M.D.   On: 08/10/2015 16:30   Ct Abdomen Pelvis W Contrast  08/10/2015  CLINICAL DATA:  75 year old female with history of severe right upper quadrant abdominal pain. EXAM: CT ABDOMEN AND PELVIS WITH CONTRAST TECHNIQUE: Multidetector CT imaging of the abdomen and pelvis was performed using the standard protocol following bolus administration of intravenous contrast. CONTRAST:  100mL ISOVUE-300 IOPAMIDOL (ISOVUE-300) INJECTION 61% COMPARISON:  CT the abdomen and pelvis 12/16/2014. FINDINGS: Lower chest:  Unremarkable. Hepatobiliary: In segment 3 of the liver there is a 1.2 cm low-attenuation lesion compatible with a simple cyst. No other suspicious hepatic lesions are noted. No intra or extrahepatic biliary ductal dilatation. Gallbladder is normal in appearance. Pancreas: No pancreatic mass. No pancreatic ductal dilatation. No pancreatic or peripancreatic fluid or inflammatory changes. Spleen: Unremarkable. Adrenals/Urinary Tract: Exophytic 7.5 cm low-attenuation lesion in the interpolar region of the left kidney is compatible with a large simple cyst. Right kidney and bilateral adrenal glands are normal in appearance. No hydroureteronephrosis. Urinary bladder is normal in appearance. Stomach/Bowel: Although the stomach generally is normal in appearance, there is what appears to be a small perforation in the anterior aspect of  the body of the stomach shortly before the antrum (image 18 of series 2 and sagittal image 56 of series 5), which is contiguous with adjacent pneumoperitoneum. No pathologic dilatation of small bowel or colon. Colonic diverticulae are present, most evident in the sigmoid colon, without surrounding inflammatory changes to suggest an acute diverticulitis at this time. There are some inflammatory changes in the right upper quadrant in close  proximity to the duodenum, hepatic flexure of the colon and gallbladder, the source of which is uncertain. The appendix is not confidently identified may be surgically absent. Regardless, there are no inflammatory changes noted adjacent to the cecum to suggest the presence of an acute appendicitis at this time. Vascular/Lymphatic: Extensive atherosclerosis throughout the abdominal and pelvic vasculature, including fusiform aneurysmal dilatation of the infrarenal abdominal aorta which measures up to 3.8 x 3.5 cm. No lymphadenopathy noted in the abdomen or pelvis. Reproductive: Status post hysterectomy. Ovaries are not confidently identified may be surgically absent or atrophic. Other: Small volume of pneumoperitoneum predominantly in the upper abdomen, including a small amount of gas in the lesser sac. Small volume of ascites. Inflammatory changes in the right upper quadrant of the abdomen, as above. Musculoskeletal: There are no aggressive appearing lytic or blastic lesions noted in the visualized portions of the skeleton. Chronic appearing compression fracture of superior endplate of L3 with approximately 15% loss of anterior vertebral body height. IMPRESSION: 1. Study is positive for pneumoperitoneum. There is evidence of possible gastric perforation in the anterior aspect of the gastric body. Surgical consultation is recommended. 2. In addition, there are inflammatory changes in the right upper quadrant of the abdomen in close proximity to the duodenum, gallbladder and hepatic flexure of the colon. It is uncertain whether or not any of these structures are related to these inflammatory changes, or these inflammatory changes are simply secondary to a chemical peritonitis developing in the setting of a gastric perforation. 3. Atherosclerosis, including fusiform aneurysmal dilatation of the infrarenal abdominal aorta which measures up to 3.8 x 3.5 cm. Recommend followup by ultrasound in 2 years. This recommendation  follows ACR consensus guidelines: White Paper of the ACR Incidental Findings Committee II on Vascular Findings. J Am Coll Radiol 2013; 10:789-794. 4. Small volume of ascites. 5. Additional incidental findings, as above. Critical Value/emergent results were called by telephone at the time of interpretation on 08/10/2015 at 4:25 pm to Dr. Marily Memos, who verbally acknowledged these results. Electronically Signed   By: Trudie Reed M.D.   On: 08/10/2015 16:26   Dg Chest Port 1 View  08/11/2015  CLINICAL DATA:  Perforated ulcer.  Smoker. EXAM: PORTABLE CHEST 1 VIEW COMPARISON:  Yesterday FINDINGS: Normal sized heart. Prominent interstitial markings. Mild bibasilar atelectasis. Poor inspiration. Nasogastric tube extending into the stomach. Diffuse osteopenia. IMPRESSION: 1. Poor inspiration with mild bibasilar atelectasis. 2. Stable chronic interstitial lung disease. Electronically Signed   By: Beckie Salts M.D.   On: 08/11/2015 07:10    Medications: . antiseptic oral rinse  7 mL Mouth Rinse BID  . enoxaparin (LOVENOX) injection  40 mg Subcutaneous Q24H  . morphine   Intravenous 6 times per day  . pantoprazole (PROTONIX) IV  40 mg Intravenous Q12H  . piperacillin-tazobactam (ZOSYN)  IV  3.375 g Intravenous 3 times per day   . dextrose 5 % and 0.45 % NaCl with KCl 20 mEq/L 50 mL/hr at 08/11/15 1658   Prior to Admission medications   Medication Sig Start Date End Date Taking? Authorizing Provider  diclofenac (  VOLTAREN) 75 MG EC tablet Take 75 mg by mouth 2 (two) times daily as needed for mild pain.   Yes Historical Provider, MD  aspirin 325 MG tablet Take 325 mg by mouth daily.    Historical Provider, MD  HYDROcodone-acetaminophen (NORCO/VICODIN) 5-325 MG per tablet 1 tab PO q12 hours prn pain Patient not taking: Reported on 08/10/2015 12/16/14   Samuel Jester, DO  ondansetron (ZOFRAN) 4 MG tablet Take 1 tablet (4 mg total) by mouth every 8 (eight) hours as needed for nausea or vomiting. Patient  not taking: Reported on 08/10/2015 12/16/14   Samuel Jester, DO    Assessment/Plan Perforated pyloric channel ulcer. S/p Exploratory laparotomy with Cheree Ditto patch closure of perforated pyloric channel ulcer COPD/tobacco use Prior hysterectomy/appendectomy Antibiotics: day 3 Zosyn DVT:  Lovenox/SCD PPI:  BID   Plan:  D/c PCA, IV tylenol, check labs tomorrow, start to mobilize, keep light on during the day.  Work on pulmonary toilet. NG is working so I will give her some ice chips.  Check for H Pylori tomorrow also.  CASE Production designer, theatre/television/film.  Add some nicotine to her daily meds.       LOS: 2 days    Brock Larmon 08/12/2015 [phone]

## 2015-08-13 ENCOUNTER — Inpatient Hospital Stay (HOSPITAL_COMMUNITY): Payer: Medicare Other

## 2015-08-13 LAB — CBC
HCT: 34.7 % — ABNORMAL LOW (ref 36.0–46.0)
Hemoglobin: 11.8 g/dL — ABNORMAL LOW (ref 12.0–15.0)
MCH: 28.9 pg (ref 26.0–34.0)
MCHC: 34 g/dL (ref 30.0–36.0)
MCV: 85 fL (ref 78.0–100.0)
PLATELETS: 184 10*3/uL (ref 150–400)
RBC: 4.08 MIL/uL (ref 3.87–5.11)
RDW: 14.4 % (ref 11.5–15.5)
WBC: 9.3 10*3/uL (ref 4.0–10.5)

## 2015-08-13 LAB — BASIC METABOLIC PANEL
ANION GAP: 8 (ref 5–15)
BUN: 10 mg/dL (ref 6–20)
CALCIUM: 8.4 mg/dL — AB (ref 8.9–10.3)
CO2: 25 mmol/L (ref 22–32)
CREATININE: 0.98 mg/dL (ref 0.44–1.00)
Chloride: 105 mmol/L (ref 101–111)
GFR calc Af Amer: >60 mL/min (ref >60)
GFR, EST NON AFRICAN AMERICAN: 56 mL/min — AB (ref >60)
GLUCOSE: 113 mg/dL — AB (ref 65–99)
Potassium: 3.9 mmol/L (ref 3.5–5.1)
Sodium: 138 mmol/L (ref 135–145)

## 2015-08-13 MED ORDER — ACETAMINOPHEN 10 MG/ML IV SOLN
1000.0000 mg | Freq: Four times a day (QID) | INTRAVENOUS | Status: AC
  Administered 2015-08-13 – 2015-08-14 (×4): 1000 mg via INTRAVENOUS
  Filled 2015-08-13 (×7): qty 100

## 2015-08-13 MED ORDER — METOPROLOL TARTRATE 1 MG/ML IV SOLN
5.0000 mg | Freq: Four times a day (QID) | INTRAVENOUS | Status: DC | PRN
  Administered 2015-08-13 – 2015-08-18 (×4): 5 mg via INTRAVENOUS
  Filled 2015-08-13 (×4): qty 5

## 2015-08-13 NOTE — Care Management Important Message (Signed)
Important Message  Patient Details  Name: Morgan PaliMary F Tiger MRN: [id number] Date of Birth: [birth year]   Medicare Important Message Given:  Yes    Haskell FlirtJamison, Cedar Roseman 08/13/2015, 10:45 AMImportant Message  Patient Details  Name: Morgan PaliMary F Buis MRN: [id number] Date of Birth: [birth year]   Medicare Important Message Given:  Yes    Haskell FlirtJamison, Jazion Atteberry 08/13/2015, 10:44 AM

## 2015-08-13 NOTE — Progress Notes (Signed)
3 Days Post-Op  Subjective: Still confused, knew she was in the hospital but that's about all she can remember. She is cooperative and follows instructions without any issue.    Objective: Vital signs in last 24 hours: Temp:  [98.3 F (36.8 C)-98.6 F (37 C)] 98.4 F (36.9 C) (04/18 0432) Pulse Rate:  [72-87] 72 (04/18 0432) Resp:  [16-19] 16 (04/18 0432) BP: (154-175)/(60-84) 175/84 mmHg (04/18 0432) SpO2:  [94 %-97 %] 97 % (04/18 0432) Last BM Date: 08/10/15 300 from NG it was accidentally lost last PM Urine 4450 IV?  Afebrile, BP trending up some Labs all look good, WBC is back to normal Intake/Output from previous day: 04/17 0701 - 04/18 0700 In: 693.8 [I.V.:693.8] Out: 4750 [Urine:4450; Emesis/NG output:300] Intake/Output this shift:    General appearance: alert, cooperative, no distress and knows she is in the hospital, but that is about all. Resp: clear to auscultation bilaterally GI: soft, still tender, fewl BS, no distension.  dressing is dry.  Lab Results:   Recent Labs  08/11/15 0328 08/13/15 0420  WBC 20.0* 9.3  HGB 12.5 11.8*  HCT 37.4 34.7*  PLT 189 184    BMET  Recent Labs  08/11/15 0328 08/13/15 0420  NA 137 138  K 4.4 3.9  CL 107 105  CO2 23 25  GLUCOSE 165* 113*  BUN 12 10  CREATININE 0.86 0.98  CALCIUM 8.5* 8.4*   PT/INR No results for input(s): LABPROT, INR in the last 72 hours.   Recent Labs Lab 08/10/15 1342  AST 19  ALT 13*  ALKPHOS 82  BILITOT 0.7  PROT 7.2  ALBUMIN 3.7     Lipase     Component Value Date/Time   LIPASE 24 08/10/2015 1342     Studies/Results: No results found.  Medications: . antiseptic oral rinse  7 mL Mouth Rinse BID  . enoxaparin (LOVENOX) injection  40 mg Subcutaneous Q24H  . nicotine  7 mg Transdermal Daily  . pantoprazole (PROTONIX) IV  40 mg Intravenous Q12H  . piperacillin-tazobactam (ZOSYN)  IV  3.375 g Intravenous 3 times per day    Assessment/Plan Perforated pyloric channel  ulcer. S/p Exploratory laparotomy with Cheree DittoGraham patch closure of perforated pyloric channel ulcer, 08/10/15, Dr. Darnell Levelodd Gerkin  POD 3 Post op confusion COPD/tobacco use Prior hysterectomy/appendectomy Antibiotics: day 4 Zosyn DVT: Lovenox/SCD PPI: BID   Plan:  I will check on when we want to do a UGI to check for leak.  D/C foley, continue to mobilize.  She is just getting IV Tylenol for pain.  i ordered IV dilaudid PRN, but I dont see where she has received any.  D/C foley and get PT to help with ambulation.  H Pylori is pending.     LOS: 3 days    Morgan Estrada 08/13/2015 [phone]

## 2015-08-13 NOTE — Progress Notes (Signed)
Dr. Johna SheriffHoxworth aware via phone pt's BP elevated this afternoon. At 1400 BP 182/92 then recently 191/80. Pt denies pain. Dr. Johna SheriffHoxworth aware of this evenings UGI results. See new order in EPIC per MD.

## 2015-08-13 NOTE — Evaluation (Signed)
Physical Therapy Evaluation Patient Details Name: Morgan Estrada MRN: [id number] DOB: 1941-03-13 Today's Date: 08/13/2015   History of Present Illness  /p Exploratory laparotomy with Cheree Ditto patch closure of perforated pyloric channel ulcer, 08/10/15, post op confusion  Clinical Impression  The patient ambulated x 150' and tolerated well. The patient will benefit from PT to address problems listed in the note below.     Follow Up Recommendations Home health PT;Supervision/Assistance - 24 hour    Equipment Recommendations  None recommended by PT    Recommendations for Other Services       Precautions / Restrictions Precautions Precautions: Fall Precaution Comments: abdomen      Mobility  Bed Mobility Overal bed mobility: Needs Assistance Bed Mobility: Supine to Sit;Rolling Rolling: Supervision   Supine to sit: Supervision     General bed mobility comments: cues for technique  Transfers Overall transfer level: Needs assistance Equipment used: Rolling walker (2 wheeled) Transfers: Sit to/from Stand Sit to Stand: Min guard            Ambulation/Gait Ambulation/Gait assistance: Min guard Ambulation Distance (Feet): 150 Feet Assistive device: Rolling walker (2 wheeled) Gait Pattern/deviations: Step-through pattern     General Gait Details: cues for safety  Stairs            Wheelchair Mobility    Modified Rankin (Stroke Patients Only)       Balance                                             Pertinent Vitals/Pain Pain Assessment: 0-10 Pain Score: 3  Pain Location: abdomen Pain Descriptors / Indicators: Discomfort;Guarding Pain Intervention(s): Limited activity within patient's tolerance;Monitored during session    Home Living Family/patient expects to be discharged to:: Private residence Living Arrangements: Children Available Help at Discharge: Family;Available 24 hours/day Type of Home: House Home Access: Stairs to  enter   [employer] of Steps: 1 Home Layout: One level Home Equipment: Walker - 2 wheels;Bedside commode      Prior Function Level of Independence: Independent               Hand Dominance        Extremity/Trunk Assessment   Upper Extremity Assessment: Overall WFL for tasks assessed           Lower Extremity Assessment: Overall WFL for tasks assessed         Communication   Communication: No difficulties  Cognition Arousal/Alertness: Awake/alert Behavior During Therapy: WFL for tasks assessed/performed Overall Cognitive Status: Impaired/Different from baseline Area of Impairment: Orientation Orientation Level: Time;Situation                  General Comments      Exercises        Assessment/Plan    PT Assessment Patient needs continued PT services  PT Diagnosis Generalized weakness;Acute pain   PT Problem List Decreased strength;Decreased activity tolerance;Decreased mobility;Pain  PT Treatment Interventions DME instruction;Gait training;Functional mobility training;Therapeutic activities;Patient/family education   PT Goals (Current goals can be found in the Care Plan section) Acute Rehab PT Goals Patient Stated Goal: to go home PT Goal Formulation: With patient/family Time For Goal Achievement: 08/27/15 Potential to Achieve Goals: Good    Frequency Min 3X/week   Barriers to discharge        Co-evaluation  End of Session   Activity Tolerance: Patient tolerated treatment well Patient left: in chair;with call bell/phone within reach;with chair alarm set;with family/visitor present Nurse Communication: Mobility status         Time: [id number] PT Time Calculation (min) (ACUTE ONLY): 27 min   Charges:   PT Evaluation $PT Eval Low Complexity: 1 Procedure PT Treatments $Gait Training: 8-22 mins   PT G Codes:        Sharen HeckHill, Jguadalupe Opiela Elizabeth Aadil Sur PT [phone]  08/13/2015, 10:22 AM

## 2015-08-13 NOTE — Progress Notes (Signed)
Pt's NGT accidentally pulled out by pt on her sleep; pt has intermittent bouts of confusion; Dr. Ezzard StandingNewman, MD on call, paged and notified of event, of which he ordered for NGT to be left out for now and be evaluated by rounding MD in the morning. Will continue to monitor.

## 2015-08-14 LAB — H. PYLORI ANTIBODY, IGG: H Pylori IgG: 6.1 U/mL — ABNORMAL HIGH (ref 0.0–0.8)

## 2015-08-14 NOTE — Progress Notes (Signed)
4 Days Post-Op  Subjective: She knows she is at [facility], the year and the month this AM.  She also wants to go home. Abdomen site looks fine.  Objective: Vital signs in last 24 hours: Temp:  [98 F (36.7 C)-98.4 F (36.9 C)] 98.4 F (36.9 C) (04/19 0541) Pulse Rate:  [67-80] 67 (04/19 0541) Resp:  [18] 18 (04/19 0541) BP: (158-207)/(63-92) 176/70 mmHg (04/19 0541) SpO2:  [98 %-99 %] 99 % (04/19 0541) Last BM Date: 08/13/15 NPO 3050 urine BM x 1 Afebrile, VSS BP is better Labs OK 08/13/15 Positive for H Pylori (PPI bid, amoxicillin 1 gm bid, clarithromycin 500 bid x 14 days) UGI:  No gastric leak is noted. Contrast partially empties into the duodenum. Patient became sick at this point and regurgitated ingested contrast and study was terminated at this point. No obstructing lesion is noted although the mid to distal third portion and the fourth portion of the duodenum were not assessed. Intake/Output from previous day: 04/18 0701 - 04/19 0700 In: 3108.8 [I.V.:2658.8; IV Piggyback:450] Out: 3051 [Urine:3050; Stool:1] Intake/Output this shift:    General appearance: alert, cooperative, no distress and she has a productive cough. Resp: productive cough, but no wheezing or rales.   GI: soft, sore, incision looks fine + BS.  + BM  Lab Results:   Recent Labs  08/13/15 0420  WBC 9.3  HGB 11.8*  HCT 34.7*  PLT 184    BMET  Recent Labs  08/13/15 0420  NA 138  K 3.9  CL 105  CO2 25  GLUCOSE 113*  BUN 10  CREATININE 0.98  CALCIUM 8.4*   PT/INR No results for input(s): LABPROT, INR in the last 72 hours.   Recent Labs Lab 08/10/15 1342  AST 19  ALT 13*  ALKPHOS 82  BILITOT 0.7  PROT 7.2  ALBUMIN 3.7     Lipase     Component Value Date/Time   LIPASE 24 08/10/2015 1342     Studies/Results: Dg Ugi W/water Sol Cm  08/13/2015  CLINICAL DATA:  75 year old female with perforated ulcer repaired 08/10/2015. Postoperative evaluation. Subsequent encounter. EXAM:  WATER SOLUBLE UPPER GI SERIES TECHNIQUE: Single-column upper GI series was performed using water soluble contrast. CONTRAST:  90 cc Gastroview. COMPARISON:  08/10/2015 CT. FLUOROSCOPY TIME:  Radiation Exposure Index (as provided by the fluoroscopic device): 24.7 mGy Fluoroscopy Time (in minutes and seconds):  2 minutes and 36 seconds FINDINGS: Scout view reveals surgical clips mid aspect the abdomen. No gastric leak is noted. Contrast partially empties into the duodenum. Patient became sick at this point and regurgitated ingested contrast and study was terminated at this point. No obstructing lesion is noted although the mid to distal third portion and the fourth portion of the duodenum were not assessed. IMPRESSION: No gastric leak is noted. Contrast partially empties into the duodenum. Patient became sick at this point and regurgitated ingested contrast and study was terminated at this point. No obstructing lesion is noted although the mid to distal third portion and the fourth portion of the duodenum were not assessed. These results will be called to the ordering clinician or representative by the Radiologist Assistant, and communication documented in the PACS or zVision Dashboard. Electronically Signed   By: Lacy Duverney M.D.   On: 08/13/2015 16:42    Medications: . antiseptic oral rinse  7 mL Mouth Rinse BID  . enoxaparin (LOVENOX) injection  40 mg Subcutaneous Q24H  . nicotine  7 mg Transdermal Daily  . pantoprazole (  PROTONIX) IV  40 mg Intravenous Q12H  . piperacillin-tazobactam (ZOSYN)  IV  3.375 g Intravenous 3 times per day   . dextrose 5 % and 0.45 % NaCl with KCl 20 mEq/L 75 mL/hr at 08/13/15 2318   Prior to Admission medications   Medication Sig Start Date End Date Taking? Authorizing Provider  diclofenac (VOLTAREN) 75 MG EC tablet Take 75 mg by mouth 2 (two) times daily as needed for mild pain.   Yes Historical Provider, MD  aspirin 325 MG tablet Take 325 mg by mouth daily.     Historical Provider, MD  HYDROcodone-acetaminophen (NORCO/VICODIN) 5-325 MG per tablet 1 tab PO q12 hours prn pain Patient not taking: Reported on 08/10/2015 12/16/14   Samuel JesterKathleen McManus, DO  ondansetron (ZOFRAN) 4 MG tablet Take 1 tablet (4 mg total) by mouth every 8 (eight) hours as needed for nausea or vomiting. Patient not taking: Reported on 08/10/2015 12/16/14   Samuel JesterKathleen McManus, DO     Assessment/Plan Perforated pyloric channel ulcer. S/p Exploratory laparotomy with Cheree DittoGraham patch closure of perforated pyloric channel ulcer, 08/10/15, Dr. Darnell Levelodd Gerkin POD 3 Post op confusion COPD/tobacco use Prior hysterectomy/appendectomy Antibiotics: day 5 Zosyn DVT: Lovenox/SCD PPI: BID    Plan:  I want to start her on clears,  Give her another day of Zosyn and then switch to triple therapy for H Pylori.  Work more in pulmonary toilet and mobilization.  Add probiotics.  LOS: 4 days    Wana Mount 08/14/2015 [phone]

## 2015-08-15 NOTE — Progress Notes (Signed)
5 Days Post-Op  Subjective: She looks good, she vomited last PM, but so far today she has been OK.  Incision looks fine, she knows where she is and the post op confusion has resolved.  Objective: Vital signs in last 24 hours: Temp:  [97.8 F (36.6 C)-98.9 F (37.2 C)] 97.8 F (36.6 C) (04/20 0453) Pulse Rate:  [67-70] 67 (04/20 0453) Resp:  [17-18] 18 (04/20 0453) BP: (171-186)/(76-88) 177/76 mmHg (04/20 0453) SpO2:  [99 %-100 %] 100 % (04/20 0453) Last BM Date: 08/15/15 480 PO Emesis x 1 BM x 2 Afebrile, VSS, BP still up some No labs today Intake/Output from previous day: 04/19 0701 - 04/20 0700 In: 2371.3 [P.O.:480; I.V.:1841.3; IV Piggyback:50] Out: 1400 [Urine:1400] Intake/Output this shift:    General appearance: alert, cooperative and no distress Resp: clear to auscultation bilaterally GI: soft, sore, + BS, site looks fine.  Lab Results:   Recent Labs  08/13/15 0420  WBC 9.3  HGB 11.8*  HCT 34.7*  PLT 184    BMET  Recent Labs  08/13/15 0420  NA 138  K 3.9  CL 105  CO2 25  GLUCOSE 113*  BUN 10  CREATININE 0.98  CALCIUM 8.4*   PT/INR No results for input(s): LABPROT, INR in the last 72 hours.   Recent Labs Lab 08/10/15 1342  AST 19  ALT 13*  ALKPHOS 82  BILITOT 0.7  PROT 7.2  ALBUMIN 3.7     Lipase     Component Value Date/Time   LIPASE 24 08/10/2015 1342     Studies/Results: Dg Ugi W/water Sol Cm  08/13/2015  CLINICAL DATA:  75 year old female with perforated ulcer repaired 08/10/2015. Postoperative evaluation. Subsequent encounter. EXAM: WATER SOLUBLE UPPER GI SERIES TECHNIQUE: Single-column upper GI series was performed using water soluble contrast. CONTRAST:  90 cc Gastroview. COMPARISON:  08/10/2015 CT. FLUOROSCOPY TIME:  Radiation Exposure Index (as provided by the fluoroscopic device): 24.7 mGy Fluoroscopy Time (in minutes and seconds):  2 minutes and 36 seconds FINDINGS: Scout view reveals surgical clips mid aspect the  abdomen. No gastric leak is noted. Contrast partially empties into the duodenum. Patient became sick at this point and regurgitated ingested contrast and study was terminated at this point. No obstructing lesion is noted although the mid to distal third portion and the fourth portion of the duodenum were not assessed. IMPRESSION: No gastric leak is noted. Contrast partially empties into the duodenum. Patient became sick at this point and regurgitated ingested contrast and study was terminated at this point. No obstructing lesion is noted although the mid to distal third portion and the fourth portion of the duodenum were not assessed. These results will be called to the ordering clinician or representative by the Radiologist Assistant, and communication documented in the PACS or zVision Dashboard. Electronically Signed   By: Lacy DuverneySteven  Olson M.D.   On: 08/13/2015 16:42    Medications: . antiseptic oral rinse  7 mL Mouth Rinse BID  . enoxaparin (LOVENOX) injection  40 mg Subcutaneous Q24H  . nicotine  7 mg Transdermal Daily  . pantoprazole (PROTONIX) IV  40 mg Intravenous Q12H  . piperacillin-tazobactam (ZOSYN)  IV  3.375 g Intravenous 3 times per day    Assessment/Plan Perforated pyloric channel ulcer. S/p Exploratory laparotomy with Cheree DittoGraham patch closure of perforated pyloric channel ulcer, 08/10/15, Dr. Darnell Levelodd Gerkin POD 3 Post op confusion COPD/tobacco use Prior hysterectomy/appendectomy Antibiotics: day 6 Zosyn DVT: Lovenox/SCD PPI: BID   Plan:  I am going to leave  her on clears today and see how she does, last PM was her first PO since surgery.  If she does well we will advance tomorrow and start her on course for H Pylori.      LOS: 5 days    Morgan Estrada 08/15/[phone]

## 2015-08-15 NOTE — Progress Notes (Signed)
PT Cancellation Note  Patient Details Name: Morgan Estrada MRN: [id number] DOB: Jul 31, 1940   Cancelled Treatment:     Pt stated she just amb with nursing staff and hopes to go home soon.  Will check back as schedule permits   Armando ReichertKropski, Myranda Pavone [name] 08/15/2015, 2:28 PM

## 2015-08-15 NOTE — Care Management Note (Signed)
Case Management Note  Patient Details  Name: Morgan Estrada MRN: [id number] Date of Birth: 1940/05/10  Subjective/Objective:    Exploratory laparotomy with Morgan Estrada of perforated pyloric channel ulcer                Action/Plan: Discharge planning, spoke with patient at beside. [facility], [facility]. Patient states she lives with her 2 adult children who will assist her at d/c.   Expected Discharge Date:                  Expected Discharge Plan:  Home w Home Health Services  In-House Referral:  NA  Discharge planning Services  CM Consult  Post Acute Care Choice:  Home Health Choice offered to:  Patient  DME Arranged:  N/A DME Agency:  NA  HH Arranged:  PT HH Agency:  [facility]  Status of Service:  Completed, signed off  Medicare Important Message Given:  Yes Date Medicare IM Given:    Medicare IM give by:    Date Additional Medicare IM Given:    Additional Medicare Important Message give by:     If discussed at Long Length of Stay Meetings, dates discussed:    Additional Comments:  Alexis Goodelleele, Race Latour K, RN 08/15/2015, 2:54 PM [phone]

## 2015-08-16 MED ORDER — CLARITHROMYCIN 500 MG PO TABS
500.0000 mg | ORAL_TABLET | Freq: Two times a day (BID) | ORAL | Status: DC
  Administered 2015-08-17 – 2015-08-18 (×3): 500 mg via ORAL
  Filled 2015-08-16 (×4): qty 1

## 2015-08-16 MED ORDER — PANTOPRAZOLE SODIUM 40 MG PO TBEC
40.0000 mg | DELAYED_RELEASE_TABLET | Freq: Two times a day (BID) | ORAL | Status: DC
  Administered 2015-08-16 – 2015-08-18 (×4): 40 mg via ORAL
  Filled 2015-08-16 (×7): qty 1

## 2015-08-16 MED ORDER — AMOXICILLIN 500 MG PO CAPS
1000.0000 mg | ORAL_CAPSULE | Freq: Two times a day (BID) | ORAL | Status: DC
  Administered 2015-08-17 – 2015-08-18 (×3): 1000 mg via ORAL
  Filled 2015-08-16 (×4): qty 2

## 2015-08-16 NOTE — Progress Notes (Signed)
6 Days Post-Op  Subjective: She did vomit x 1 yesterday PM, says the broth did it.  She seems to be doing well.  Some cramps but other wise no complaints  Objective: Vital signs in last 24 hours: Temp:  [97.4 F (36.3 C)-99.2 F (37.3 C)] 97.4 F (36.3 C) (04/21 0503) Pulse Rate:  [58-64] 64 (04/21 0503) Resp:  [16-18] 16 (04/21 0503) BP: (159-168)/(64-74) 159/69 mmHg (04/21 0503) SpO2:  [98 %-100 %] 100 % (04/21 0503) Last BM Date: 08/15/15 480 PO 1802 IV Voided x 6 Emesis x 1 Stool x 2  Afebrile, VSS No labs  ke/Output from previous day: 04/20 0701 - 04/21 0700 In: 2282.5 [P.O.:480; I.V.:1802.5] Out: -  Intake/Output this shift: Total I/O In: 120 [P.O.:120] Out: -   General appearance: alert, cooperative and no distress Resp: clear to auscultation bilaterally GI: soft, sore, point to one area and says she has some cramps.  Incision looks good.   Lab Results:  No results for input(s): WBC, HGB, HCT, PLT in the last 72 hours.  BMET No results for input(s): NA, K, CL, CO2, GLUCOSE, BUN, CREATININE, CALCIUM in the last 72 hours. PT/INR No results for input(s): LABPROT, INR in the last 72 hours.   Recent Labs Lab 08/10/15 1342  AST 19  ALT 13*  ALKPHOS 82  BILITOT 0.7  PROT 7.2  ALBUMIN 3.7     Lipase     Component Value Date/Time   LIPASE 24 08/10/2015 1342     Studies/Results: No results found.  Medications: . antiseptic oral rinse  7 mL Mouth Rinse BID  . enoxaparin (LOVENOX) injection  40 mg Subcutaneous Q24H  . nicotine  7 mg Transdermal Daily  . pantoprazole (PROTONIX) IV  40 mg Intravenous Q12H  . piperacillin-tazobactam (ZOSYN)  IV  3.375 g Intravenous 3 times per day    Assessment/Plan Perforated pyloric channel ulcer. S/p Exploratory laparotomy with Cheree DittoGraham patch closure of perforated pyloric channel ulcer, 08/10/15, Dr. Darnell Levelodd Gerkin POD 7 Post op confusion - resolved now COPD/tobacco use Prior hysterectomy/appendectomy Antibiotics:  day 7 Zosyn DVT: Lovenox/SCD PPI: BID   Plan:  Stop Zosyn after last dose tonight, In AM I will start the oral antibiotics for the H Pylori(PPI bid, amoxicillin 1 gm bid, clarithromycin 500 bid x 14 days) .   Recheck labs in AM.    LOS: 6 days    Theora Vankirk 08/16/2015 [phone]

## 2015-08-16 NOTE — Progress Notes (Signed)
Physical Therapy Treatment Patient Details Name: Morgan PaliMary F Estrada MRN: [id number] DOB: 01/17/1941 Today's Date: 08/16/2015    History of Present Illness /p Exploratory laparotomy with Cheree DittoGraham patch closure of perforated pyloric channel ulcer, 08/10/15, post op confusion    PT Comments    Assisted OOB to amb a greater distance in hallway.  Progressing well.   Follow Up Recommendations  Home health PT;Supervision/Assistance - 24 hour     Equipment Recommendations  None recommended by PT    Recommendations for Other Services       Precautions / Restrictions Restrictions Weight Bearing Restrictions: No    Mobility  Bed Mobility Overal bed mobility: Needs Assistance Bed Mobility: Supine to Sit;Rolling Rolling: Supervision   Supine to sit: Supervision     General bed mobility comments: cues for technique  Transfers Overall transfer level: Needs assistance Equipment used: Rolling walker (2 wheeled) Transfers: Sit to/from Stand Sit to Stand: Min guard         General transfer comment: one VC safety with turn completion   Ambulation/Gait Ambulation/Gait assistance: Min guard;Supervision Ambulation Distance (Feet): 175 Feet Assistive device: Rolling walker (2 wheeled) Gait Pattern/deviations: Step-through pattern;Decreased stride length Gait velocity: decreased   General Gait Details: one VC safety with turns   Information systems managertairs            Wheelchair Mobility    Modified Rankin (Stroke Patients Only)       Balance                                    Cognition Arousal/Alertness: Awake/alert Behavior During Therapy: WFL for tasks assessed/performed                        Exercises      General Comments        Pertinent Vitals/Pain Pain Assessment: Faces Faces Pain Scale: Hurts a little bit Pain Location: ABD Pain Descriptors / Indicators: Discomfort;Grimacing Pain Intervention(s): Monitored during session;Repositioned    Home  Living                      Prior Function            PT Goals (current goals can now be found in the care plan section) Progress towards PT goals: Progressing toward goals    Frequency  Min 3X/week    PT Plan Current plan remains appropriate    Co-evaluation             End of Session Equipment Utilized During Treatment: Gait belt Activity Tolerance: Patient tolerated treatment well Patient left: in bed;with call bell/phone within reach;with bed alarm set     Time: 1105-1130 PT Time Calculation (min) (ACUTE ONLY): 25 min  Charges:  $Gait Training: 8-22 mins $Therapeutic Activity: 8-22 mins                    G Codes:      Felecia ShellingLori Nechelle Petrizzo  PTA [facility]  Acute  Rehab Pager      [phone]

## 2015-08-17 LAB — COMPREHENSIVE METABOLIC PANEL
ALT: 22 U/L (ref 14–54)
ANION GAP: 11 (ref 5–15)
AST: 21 U/L (ref 15–41)
Albumin: 2.8 g/dL — ABNORMAL LOW (ref 3.5–5.0)
Alkaline Phosphatase: 94 U/L (ref 38–126)
BUN: 6 mg/dL (ref 6–20)
CALCIUM: 9.1 mg/dL (ref 8.9–10.3)
CHLORIDE: 102 mmol/L (ref 101–111)
CO2: 25 mmol/L (ref 22–32)
CREATININE: 0.94 mg/dL (ref 0.44–1.00)
GFR, EST NON AFRICAN AMERICAN: 59 mL/min — AB (ref >60)
Glucose, Bld: 111 mg/dL — ABNORMAL HIGH (ref 65–99)
Potassium: 3.9 mmol/L (ref 3.5–5.1)
Sodium: 138 mmol/L (ref 135–145)
Total Bilirubin: 0.6 mg/dL (ref 0.3–1.2)
Total Protein: 6.1 g/dL — ABNORMAL LOW (ref 6.5–8.1)

## 2015-08-17 LAB — CBC
HEMATOCRIT: 35.4 % — AB (ref 36.0–46.0)
HEMOGLOBIN: 12 g/dL (ref 12.0–15.0)
MCH: 29.1 pg (ref 26.0–34.0)
MCHC: 33.9 g/dL (ref 30.0–36.0)
MCV: 85.9 fL (ref 78.0–100.0)
PLATELETS: 266 10*3/uL (ref 150–400)
RBC: 4.12 MIL/uL (ref 3.87–5.11)
RDW: 14.3 % (ref 11.5–15.5)
WBC: 7.1 10*3/uL (ref 4.0–10.5)

## 2015-08-17 MED ORDER — ALUM & MAG HYDROXIDE-SIMETH 200-200-20 MG/5ML PO SUSP
30.0000 mL | Freq: Four times a day (QID) | ORAL | Status: DC | PRN
  Administered 2015-08-17: 30 mL via ORAL
  Filled 2015-08-17: qty 30

## 2015-08-17 NOTE — Progress Notes (Signed)
7 Days Post-Op  Subjective: She did vomit x 1 yesterday PM.  She seems to be doing well.  Some cramps but other wise no complaints  Objective: Vital signs in last 24 hours: Temp:  [97.7 F (36.5 C)-98.2 F (36.8 C)] 98.2 F (36.8 C) (04/22 0544) Pulse Rate:  [65-73] 65 (04/22 0544) Resp:  [16] 16 (04/22 0544) BP: (131-171)/(73-86) 131/82 mmHg (04/22 0544) SpO2:  [97 %-100 %] 98 % (04/22 0544) Last BM Date: 08/16/15  ke/Output from previous day: 04/21 0701 - 04/22 0700 In: 1369.6 [P.O.:360; I.V.:709.6; IV Piggyback:300] Out: 0  Intake/Output this shift:    General appearance: alert, cooperative and no distress Resp: clear to auscultation bilaterally GI: soft, sore, point to one area and says she has some cramps.  Incision looks good.   Lab Results:   Recent Labs  08/17/15 0428  WBC 7.1  HGB 12.0  HCT 35.4*  PLT 266    BMET  Recent Labs  08/17/15 0428  NA 138  K 3.9  CL 102  CO2 25  GLUCOSE 111*  BUN 6  CREATININE 0.94  CALCIUM 9.1   PT/INR No results for input(s): LABPROT, INR in the last 72 hours.   Recent Labs Lab 08/10/15 1342 08/17/15 0428  AST 19 21  ALT 13* 22  ALKPHOS 82 94  BILITOT 0.7 0.6  PROT 7.2 6.1*  ALBUMIN 3.7 2.8*     Lipase     Component Value Date/Time   LIPASE 24 08/10/2015 1342     Studies/Results: No results found.  Medications: . amoxicillin  1,000 mg Oral Q12H  . antiseptic oral rinse  7 mL Mouth Rinse BID  . clarithromycin  500 mg Oral Q12H  . enoxaparin (LOVENOX) injection  40 mg Subcutaneous Q24H  . nicotine  7 mg Transdermal Daily  . pantoprazole  40 mg Oral BID    Assessment/Plan Perforated pyloric channel ulcer. S/p Exploratory laparotomy with Cheree DittoGraham patch closure of perforated pyloric channel ulcer, 08/10/15, Dr. Darnell Levelodd Gerkin POD 7 Post op confusion - resolved now COPD/tobacco use Prior hysterectomy/appendectomy Antibiotics: day 7 Zosyn DVT: Lovenox/SCD PPI: BID   Plan:  Cont oral antibiotics  for the H Pylori(PPI bid, amoxicillin 1 gm bid, clarithromycin 500 bid x 14 days) .   Cont liquids.  Will be able to d/c once able to tolerate a diet without vomiting.      LOS: 7 days    Romie LeveeHOMAS, Natassia Guthridge C. 08/17/2015 [phone]

## 2015-08-18 MED ORDER — PANTOPRAZOLE SODIUM 40 MG PO TBEC: 40.0000 mg | DELAYED_RELEASE_TABLET | Freq: Two times a day (BID) | ORAL | Status: AC

## 2015-08-18 MED ORDER — AMOXICILLIN 500 MG PO CAPS: 1000.0000 mg | ORAL_CAPSULE | Freq: Two times a day (BID) | ORAL | Status: AC

## 2015-08-18 MED ORDER — CLARITHROMYCIN 500 MG PO TABS: 500.0000 mg | ORAL_TABLET | Freq: Two times a day (BID) | ORAL | Status: AC

## 2015-08-18 NOTE — Progress Notes (Signed)
8 Days Post-Op  Subjective: No vomiting in the last 24 hrs.  She seems to be doing well.    Objective: Vital signs in last 24 hours: Temp:  [98 F (36.7 C)] 98 F (36.7 C) (04/23 0542) Pulse Rate:  [66-72] 67 (04/23 0542) Resp:  [18] 18 (04/23 0542) BP: (136-178)/(70-83) 178/83 mmHg (04/23 0542) SpO2:  [97 %-99 %] 99 % (04/23 0542) Last BM Date: 08/17/15  ke/Output from previous day: 04/22 0701 - 04/23 0700 In: 1320 [P.O.:720; I.V.:600] Out: -  Intake/Output this shift: Total I/O In: 120 [P.O.:120] Out: -   General appearance: alert, cooperative and no distress Resp: clear to auscultation bilaterally GI: soft, less sore.  Incision looks good.   Lab Results:   Recent Labs  08/17/15 0428  WBC 7.1  HGB 12.0  HCT 35.4*  PLT 266    BMET  Recent Labs  08/17/15 0428  NA 138  K 3.9  CL 102  CO2 25  GLUCOSE 111*  BUN 6  CREATININE 0.94  CALCIUM 9.1   PT/INR No results for input(s): LABPROT, INR in the last 72 hours.   Recent Labs Lab 08/17/15 0428  AST 21  ALT 22  ALKPHOS 94  BILITOT 0.6  PROT 6.1*  ALBUMIN 2.8*     Lipase     Component Value Date/Time   LIPASE 24 08/10/2015 1342     Studies/Results: No results found.  Medications: . amoxicillin  1,000 mg Oral Q12H  . antiseptic oral rinse  7 mL Mouth Rinse BID  . clarithromycin  500 mg Oral Q12H  . enoxaparin (LOVENOX) injection  40 mg Subcutaneous Q24H  . nicotine  7 mg Transdermal Daily  . pantoprazole  40 mg Oral BID    Assessment/Plan Perforated pyloric channel ulcer. S/p Exploratory laparotomy with Cheree DittoGraham patch closure of perforated pyloric channel ulcer, 08/10/15, Dr. Darnell Levelodd Gerkin POD 7 Post op confusion - resolved now COPD/tobacco use Prior hysterectomy/appendectomy Antibiotics: day 7 Zosyn DVT: Lovenox/SCD PPI: BID   Plan:  Cont oral antibiotics for the H Pylori(PPI bid, amoxicillin 1 gm bid, clarithromycin 500 bid x 14 days) .   Advance to solid diet.  Will be able to  d/c once able to tolerate a diet without vomiting.  Hopefully later today    LOS: 8 days    Harshan Kearley C. 08/18/2015

## 2015-08-18 NOTE — Care Management Note (Signed)
Case Management Note  Patient Details  Name: Wilmon PaliMary F Losasso MRN: [id number] Date of Birth: November 04, 1940  Subjective/Objective:     Exploratory Laparotomy with Cheree DittoGraham patch closure of perforated pyloric channel ulcer                Action/Plan: Discharge Planning: AVS reviewed:  [facility].    Expected Discharge Date:  08/18/2015              Expected Discharge Plan:  Home w Home Health Services  In-House Referral:  NA  Discharge planning Services  CM Consult  Post Acute Care Choice:  Home Health Choice offered to:  Patient  DME Arranged:  N/A DME Agency:  NA  HH Arranged:  PT HH Agency:  [facility]  Status of Service:  Completed, signed off  Medicare Important Message Given:  Yes Date Medicare IM Given:    Medicare IM give by:    Date Additional Medicare IM Given:    Additional Medicare Important Message give by:     If discussed at Long Length of Stay Meetings, dates discussed:    Additional Comments:  Elliot CousinShavis, Jazmyne Beauchesne Ellen, RN 08/18/2015, 3:04 PM

## 2015-08-18 NOTE — Progress Notes (Signed)
Ate 1 pancake and a fruit cup. Tolerated without nausea , vomiting. States feels ready and desires discharge.

## 2015-08-18 NOTE — Discharge Instructions (Signed)

## 2015-08-28 NOTE — Discharge Summary (Signed)
Physician Discharge Summary  Patient ID: Morgan PaliMary F Schiller MRN: [id number] DOB/AGE: 10/16/1940 75 y.o.  Admit date: 08/10/2015 Discharge date: 08/18/2015  Admission Diagnoses:  Perforated pyloric channel ulcer COPD/tobacco use Prior hysterectomy/appendectomy  Discharge Diagnoses:  Perforated pyloric channel ulcer. Post op confusion - resolved now H pylori positive COPD/tobacco use Prior hysterectomy/appendectomy   Principal Problem:   Perforated ulcer (HCC) Active Problems:   Perforated viscus   COPD (chronic obstructive pulmonary disease) (HCC)   PROCEDURES: S/p Exploratory laparotomy with Cheree DittoGraham patch closure of perforated pyloric channel ulcer, 08/10/15, Dr. Alean Rinneodd Gerkin   Hospital Course:  patient is a 75 yo WF [facility] suspected perforated ulcer. Patient has been taking Voltaren for 3 weeks for back pain. Distant hx of ulcer disease. Increased pain last 2-3 days. At [facility] ER, WBC elevated at 22K. CTA shows free air in the upper abdomen with inflammatory changes. Suspect perforated distal stomach per radiologist. She had a history of prior appendectomy and COPD.  She was seen by Dr. Gerrit FriendsGerkin and taken to the OR that evening.  NG suction was maintained, and she made progress over then 24 hours.  On POD 2 she was extremely confused, NG continued so we gave her some ice chips.  We put her on nicotine patch also. Her confusion improved slowly. UGI was completed on 08/14/15 and no leak was noted.  She was started on clears. She vomited that PM, so we kept her on clears another 48 hours.  Her H Pylori was positive. We kept her on IV antibiotics for 7 days then converted her to PO amoxicillin and clarithromycin for her H pylori after she was taking PO's well.  She was able to tolerate full liquids and diet was advanced to soft.  She was discharged later on 08/18/15 by Dr. Maisie Fushomas.  Follow up as noted below.    CBC Latest Ref Rng 08/17/2015 08/13/2015 08/11/2015  WBC 4.0 -  10.5 K/uL 7.1 9.3 20.0(H)  Hemoglobin 12.0 - 15.0 g/dL 40.912.0 11.8(L) 12.5  Hematocrit 36.0 - 46.0 % 35.4(L) 34.7(L) 37.4  Platelets 150 - 400 K/uL 266 184 189   CMP Latest Ref Rng 08/17/2015 08/13/2015 08/11/2015  Glucose 65 - 99 mg/dL 811(B111(H) 147(W113(H) 295(A165(H)  BUN 6 - 20 mg/dL 6 10 12   Creatinine 0.44 - 1.00 mg/dL 2.130.94 0.860.98 5.780.86  Sodium 135 - 145 mmol/L 138 138 137  Potassium 3.5 - 5.1 mmol/L 3.9 3.9 4.4  Chloride 101 - 111 mmol/L 102 105 107  CO2 22 - 32 mmol/L 25 25 23   Calcium 8.9 - 10.3 mg/dL 9.1 4.6(N8.4(L) 6.2(X8.5(L)  Total Protein 6.5 - 8.1 g/dL 6.1(L) - -  Total Bilirubin 0.3 - 1.2 mg/dL 0.6 - -  Alkaline Phos 38 - 126 U/L 94 - -  AST 15 - 41 U/L 21 - -  ALT 14 - 54 U/L 22 - -   H Pylori IgG 0.0 - 0.8 U/mL 6.1 (H)   Comments: (NOTE)                Negative      <0.9                Indeterminate 0.9 - 1.0                Positive      >1.0           Condition on discharge:  Improved   Disposition: 01-Home or Self Care     Medication List  STOP taking these medications        diclofenac 75 MG EC tablet  Commonly known as:  VOLTAREN      TAKE these medications        amoxicillin 500 MG capsule  Commonly known as:  AMOXIL  Take 2 capsules (1,000 mg total) by mouth every 12 (twelve) hours.     aspirin 325 MG tablet  Take 325 mg by mouth daily.     clarithromycin 500 MG tablet  Commonly known as:  BIAXIN  Take 1 tablet (500 mg total) by mouth every 12 (twelve) hours.     HYDROcodone-acetaminophen 5-325 MG tablet  Commonly known as:  NORCO/VICODIN  1 tab PO q12 hours prn pain     ondansetron 4 MG tablet  Commonly known as:  ZOFRAN  Take 1 tablet (4 mg total) by mouth every 8 (eight) hours as needed for nausea or vomiting.     pantoprazole 40 MG tablet  Commonly known as:  PROTONIX  Take 1 tablet (40 mg total) by mouth 2 (two) times daily.           Follow-up Information    Follow up with [facility].   Why:  physical therapy   Contact information:   [address] [place] [address] [phone]       Follow up with Velora Heckler, MD. Schedule an appointment as soon as possible for a visit in 2 weeks.   Specialty:  General Surgery   Contact information:   [address] [place] [address] [phone]       Signed: Sherrie George 08/28/2015, 2:51 PM

## 2016-07-13 IMAGING — CT CT ABD-PELV W/ CM
2 of 5 series · 16 of 46 positions shown, 18 images · IV contrast (omnipaque)
Comparison: None.

CLINICAL DATA: Right upper quadrant sharp abdominal pain.

EXAM:
CT ABDOMEN AND PELVIS WITH CONTRAST
TECHNIQUE: Multidetector CT imaging of the abdomen and pelvis was performed
using the standard protocol following bolus administration of
intravenous contrast.
CONTRAST:  50mL OMNIPAQUE IOHEXOL 300 MG/ML SOLN, 100mL OMNIPAQUE
IOHEXOL 300 MG/ML SOLN

[Series 2: abd_pel_with 5.0 b40f · axial · 0.68mm/px · z∈[-454,-80]mm · 13 of 87 slices shown, 15 images]
[im 6/87  soft-tissue]
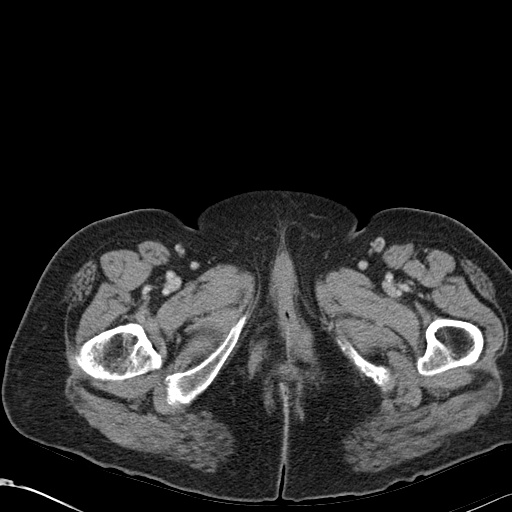
[im 6/87  bone]
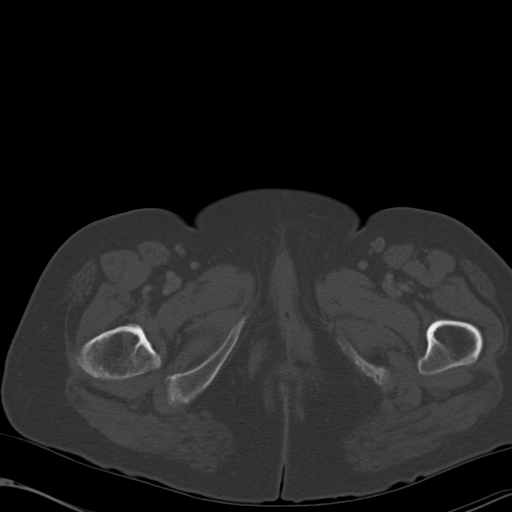
[im 11/87  soft-tissue]
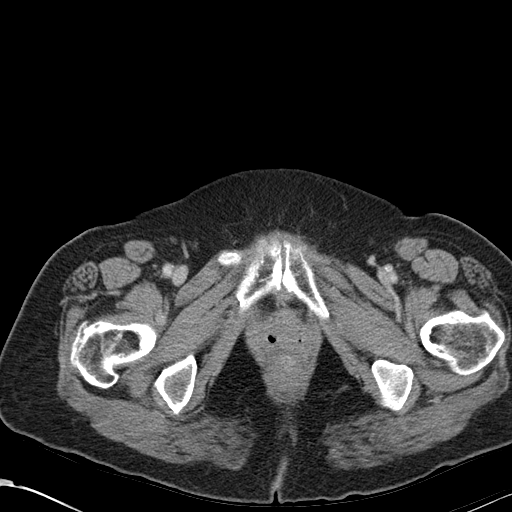
[im 21/87  soft-tissue]
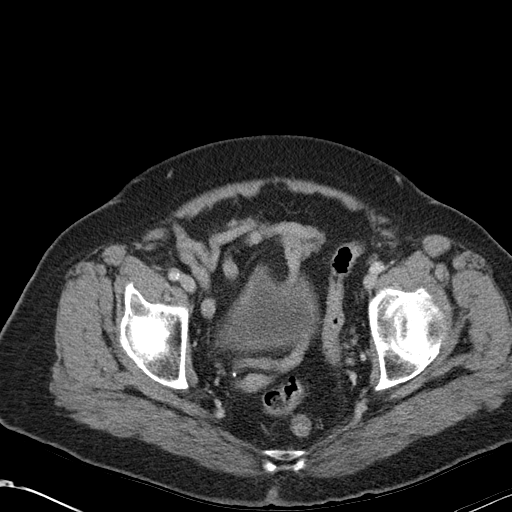
[im 26/87  soft-tissue]
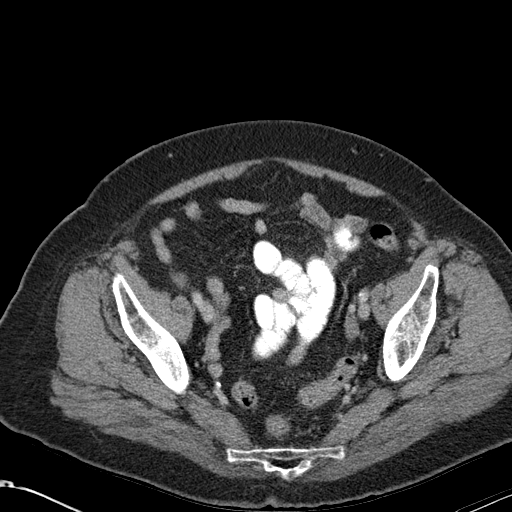
[im 31/87  soft-tissue]
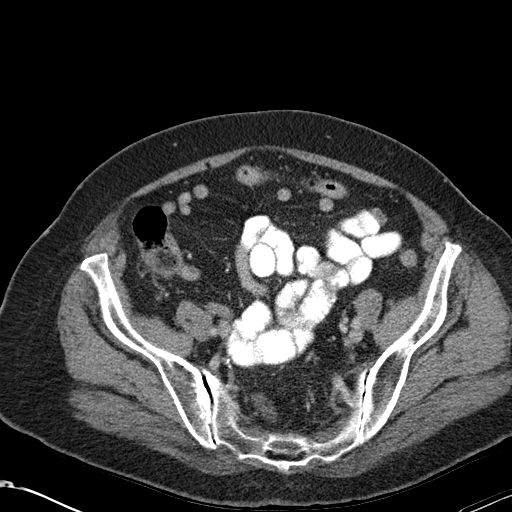
[im 36/87  soft-tissue]
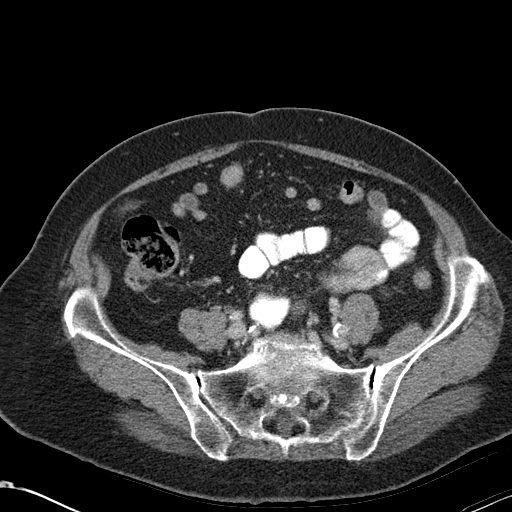
[im 46/87  soft-tissue]
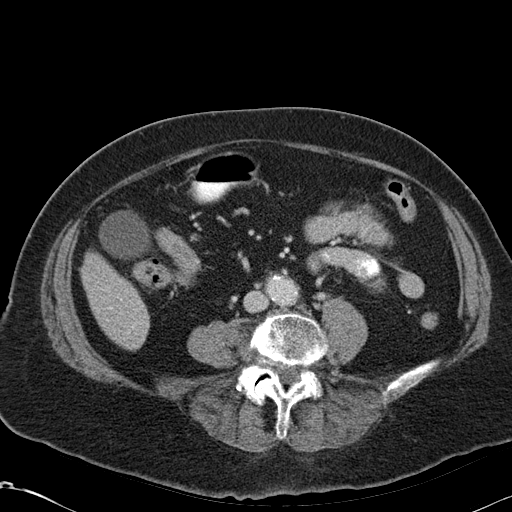
[im 51/87  soft-tissue]
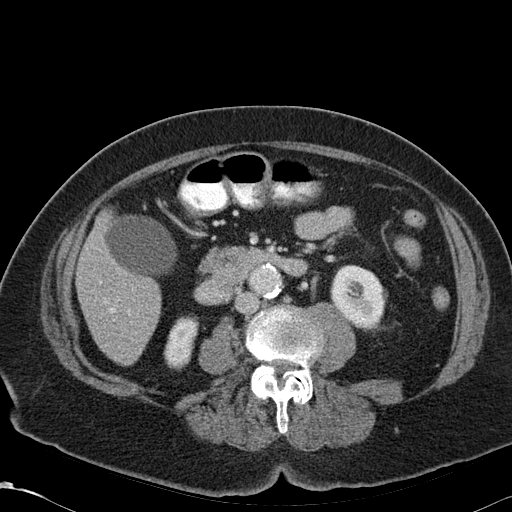
[im 56/87  soft-tissue]
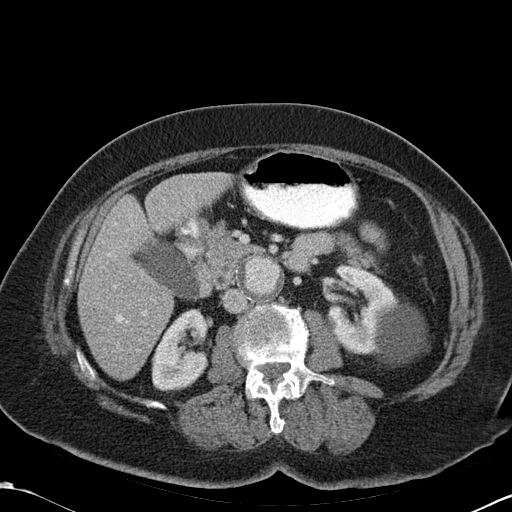
[im 56/87  bone]
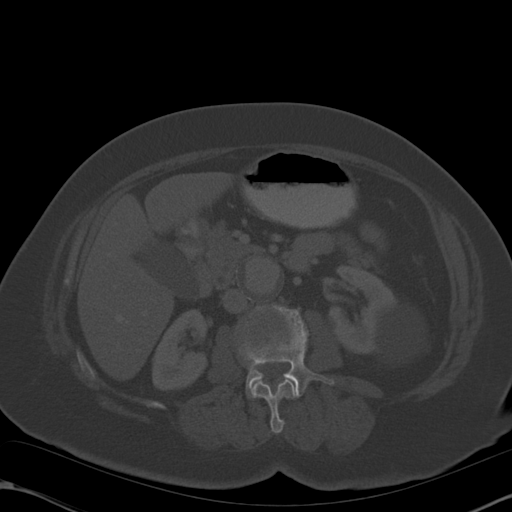
[im 61/87  soft-tissue]
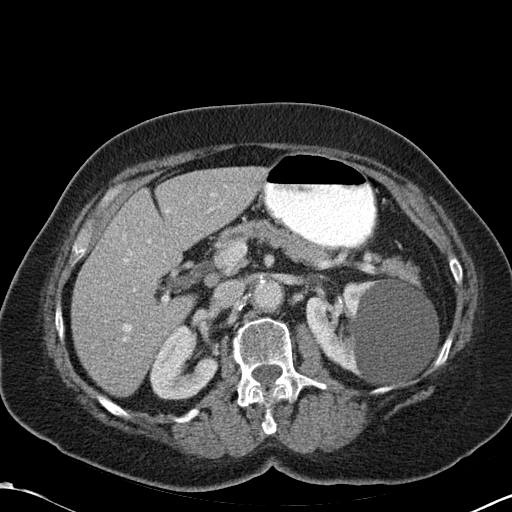
[im 66/87  soft-tissue]
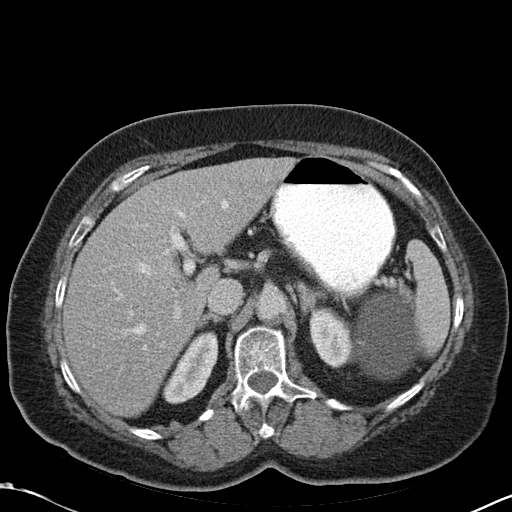
[im 76/87  soft-tissue]
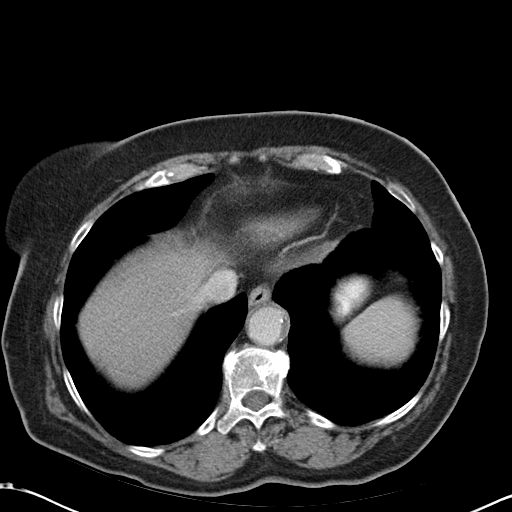
[im 81/87  soft-tissue]
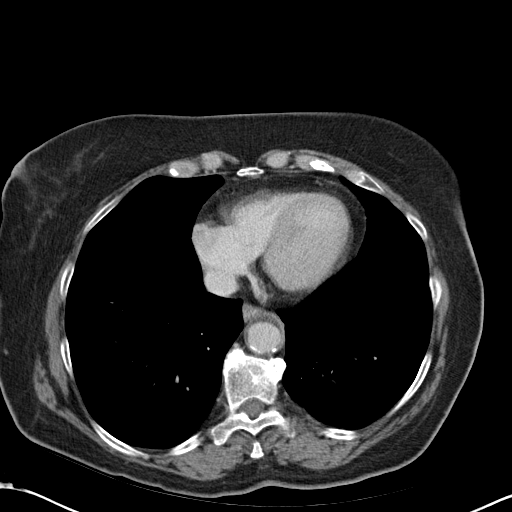

[Series 3: abd_pel_with 3.0 spo · coronal · 0.72mm/px · 3 of 102 slices shown]
[im 34/102  soft-tissue]
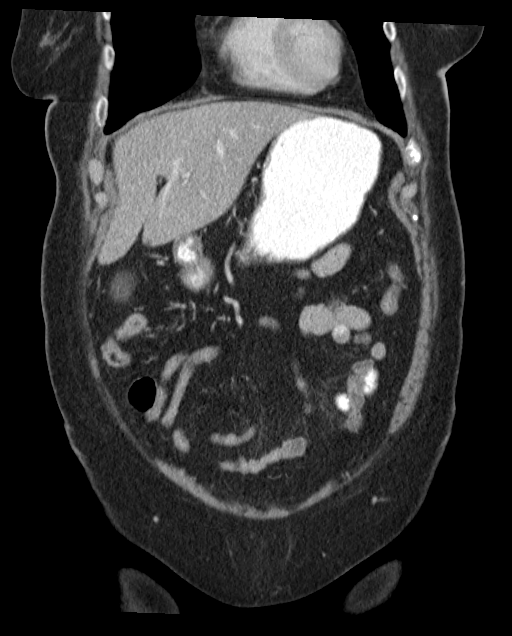
[im 45/102  soft-tissue]
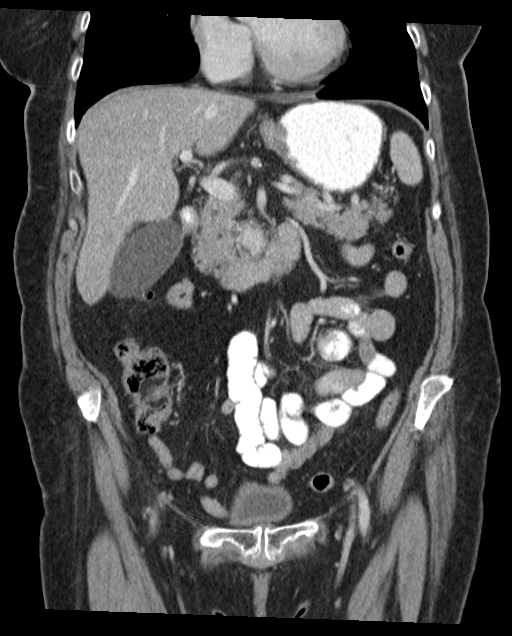
[im 57/102  soft-tissue]
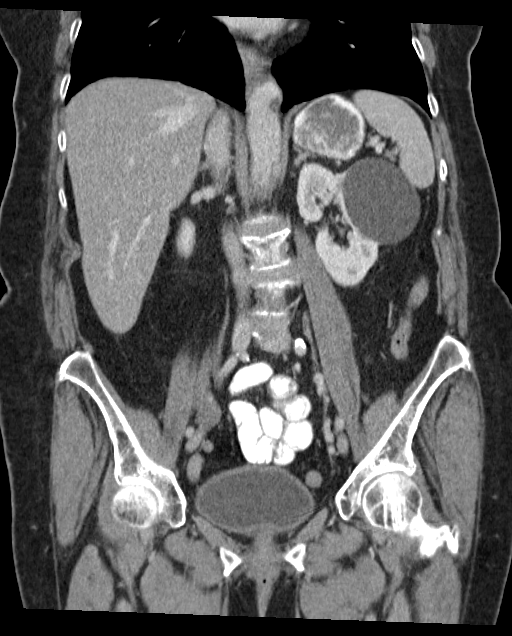

[16 of 46 positions shown; findings below may reference images not displayed]

FINDINGS: BODY WALL: No contributory findings.

LOWER CHEST: Lipomatous hypertrophy of the interatrial septum,
incidental.

ABDOMEN/PELVIS:

Liver: 1 cm cyst low in segment 4B.  No significant finding.

Biliary: Small calcified seen near the fundus. Gallbladder is full
but there is no surrounding inflammatory change.

Pancreas: Unremarkable.

Spleen: Unremarkable.

Adrenals: Unremarkable.

Kidneys and ureters: No hydronephrosis or stone. Nearly 8 cm simple
left renal cyst.

Bladder: Unremarkable.

Reproductive: Hysterectomy with negative adnexa.

Bowel: Mild distal colonic diverticulosis. No bowel obstruction or
inflammatory change. Appendectomy.

Retroperitoneum: No mass or adenopathy.

Peritoneum: No ascites or pneumoperitoneum.

Vascular: Diffuse atherosclerosis with 34 mm infrarenal aortic
aneurysm.

OSSEOUS: Remote appearing L3 superior endplate fracture.

Focally advanced L4-5 and L5-S1 degenerative disc narrowing and
facet arthropathy.
IMPRESSION: 1. No acute finding.
2. Cholelithiasis without evidence of cholecystitis.
3. 34 mm abdominal aortic aneurysm. Recommend followup by ultrasound
in 3 years. This recommendation follows ACR consensus guidelines:
White Paper of the ACR Incidental Findings Committee II on Vascular
Findings. [HOSPITAL] 8422; [DATE]

## 2017-09-24 DIAGNOSIS — Z79899 Other long term (current) drug therapy: Secondary | ICD-10-CM | POA: Diagnosis not present

## 2017-09-27 DIAGNOSIS — Z6825 Body mass index (BMI) 25.0-25.9, adult: Secondary | ICD-10-CM | POA: Diagnosis not present

## 2017-09-27 DIAGNOSIS — Z0000 Encounter for general adult medical examination without abnormal findings: Secondary | ICD-10-CM | POA: Diagnosis not present

## 2017-09-27 DIAGNOSIS — E782 Mixed hyperlipidemia: Secondary | ICD-10-CM | POA: Diagnosis not present

## 2017-09-27 DIAGNOSIS — R944 Abnormal results of kidney function studies: Secondary | ICD-10-CM | POA: Diagnosis not present

## 2017-09-27 DIAGNOSIS — F17200 Nicotine dependence, unspecified, uncomplicated: Secondary | ICD-10-CM | POA: Diagnosis not present

## 2018-03-08 DIAGNOSIS — T300 Burn of unspecified body region, unspecified degree: Secondary | ICD-10-CM | POA: Diagnosis not present

## 2018-03-08 DIAGNOSIS — H905 Unspecified sensorineural hearing loss: Secondary | ICD-10-CM | POA: Diagnosis not present

## 2018-03-08 DIAGNOSIS — T24001A Burn of unspecified degree of unspecified site of right lower limb, except ankle and foot, initial encounter: Secondary | ICD-10-CM | POA: Diagnosis not present

## 2018-03-08 DIAGNOSIS — R410 Disorientation, unspecified: Secondary | ICD-10-CM | POA: Diagnosis not present

## 2018-03-08 DIAGNOSIS — F339 Major depressive disorder, recurrent, unspecified: Secondary | ICD-10-CM | POA: Diagnosis not present

## 2018-03-08 DIAGNOSIS — R1111 Vomiting without nausea: Secondary | ICD-10-CM | POA: Diagnosis not present

## 2018-03-08 DIAGNOSIS — I6389 Other cerebral infarction: Secondary | ICD-10-CM | POA: Diagnosis not present

## 2018-03-08 DIAGNOSIS — G9340 Encephalopathy, unspecified: Secondary | ICD-10-CM | POA: Diagnosis not present

## 2018-03-08 DIAGNOSIS — I6381 Other cerebral infarction due to occlusion or stenosis of small artery: Secondary | ICD-10-CM | POA: Diagnosis not present

## 2018-03-08 DIAGNOSIS — Z23 Encounter for immunization: Secondary | ICD-10-CM | POA: Diagnosis not present

## 2018-03-08 DIAGNOSIS — I6523 Occlusion and stenosis of bilateral carotid arteries: Secondary | ICD-10-CM | POA: Diagnosis not present

## 2018-03-08 DIAGNOSIS — I6781 Acute cerebrovascular insufficiency: Secondary | ICD-10-CM | POA: Diagnosis not present

## 2018-03-08 DIAGNOSIS — I519 Heart disease, unspecified: Secondary | ICD-10-CM | POA: Diagnosis not present

## 2018-03-08 DIAGNOSIS — N289 Disorder of kidney and ureter, unspecified: Secondary | ICD-10-CM | POA: Diagnosis not present

## 2018-03-08 DIAGNOSIS — Z7982 Long term (current) use of aspirin: Secondary | ICD-10-CM | POA: Diagnosis not present

## 2018-03-08 DIAGNOSIS — R4182 Altered mental status, unspecified: Secondary | ICD-10-CM | POA: Diagnosis not present

## 2018-03-08 DIAGNOSIS — N390 Urinary tract infection, site not specified: Secondary | ICD-10-CM | POA: Diagnosis not present

## 2018-03-08 DIAGNOSIS — I63411 Cerebral infarction due to embolism of right middle cerebral artery: Secondary | ICD-10-CM | POA: Diagnosis not present

## 2018-03-08 DIAGNOSIS — R29702 NIHSS score 2: Secondary | ICD-10-CM | POA: Diagnosis not present

## 2018-03-08 DIAGNOSIS — R0689 Other abnormalities of breathing: Secondary | ICD-10-CM | POA: Diagnosis not present

## 2018-03-08 DIAGNOSIS — T24031D Burn of unspecified degree of right lower leg, subsequent encounter: Secondary | ICD-10-CM | POA: Diagnosis not present

## 2018-03-08 DIAGNOSIS — I358 Other nonrheumatic aortic valve disorders: Secondary | ICD-10-CM | POA: Diagnosis not present

## 2018-03-08 DIAGNOSIS — Z720 Tobacco use: Secondary | ICD-10-CM | POA: Diagnosis not present

## 2018-03-08 DIAGNOSIS — M6281 Muscle weakness (generalized): Secondary | ICD-10-CM | POA: Diagnosis not present

## 2018-03-08 DIAGNOSIS — I639 Cerebral infarction, unspecified: Secondary | ICD-10-CM | POA: Diagnosis not present

## 2018-03-08 DIAGNOSIS — F4321 Adjustment disorder with depressed mood: Secondary | ICD-10-CM | POA: Diagnosis not present

## 2018-03-08 DIAGNOSIS — I69328 Other speech and language deficits following cerebral infarction: Secondary | ICD-10-CM | POA: Diagnosis not present

## 2018-03-08 DIAGNOSIS — I700 Atherosclerosis of aorta: Secondary | ICD-10-CM | POA: Diagnosis not present

## 2018-03-08 DIAGNOSIS — I10 Essential (primary) hypertension: Secondary | ICD-10-CM | POA: Diagnosis not present

## 2018-03-08 DIAGNOSIS — R2689 Other abnormalities of gait and mobility: Secondary | ICD-10-CM | POA: Diagnosis not present

## 2018-03-08 DIAGNOSIS — I6789 Other cerebrovascular disease: Secondary | ICD-10-CM | POA: Diagnosis not present

## 2018-03-08 DIAGNOSIS — I771 Stricture of artery: Secondary | ICD-10-CM | POA: Diagnosis not present

## 2018-03-08 DIAGNOSIS — T24131A Burn of first degree of right lower leg, initial encounter: Secondary | ICD-10-CM | POA: Diagnosis not present

## 2018-03-18 DIAGNOSIS — F4321 Adjustment disorder with depressed mood: Secondary | ICD-10-CM | POA: Diagnosis not present

## 2018-03-18 DIAGNOSIS — F339 Major depressive disorder, recurrent, unspecified: Secondary | ICD-10-CM | POA: Diagnosis not present

## 2018-03-18 DIAGNOSIS — T24031D Burn of unspecified degree of right lower leg, subsequent encounter: Secondary | ICD-10-CM | POA: Diagnosis not present

## 2018-03-18 DIAGNOSIS — I69328 Other speech and language deficits following cerebral infarction: Secondary | ICD-10-CM | POA: Diagnosis not present

## 2018-03-18 DIAGNOSIS — I6389 Other cerebral infarction: Secondary | ICD-10-CM | POA: Diagnosis not present

## 2018-03-18 DIAGNOSIS — Z23 Encounter for immunization: Secondary | ICD-10-CM | POA: Diagnosis not present

## 2018-03-18 DIAGNOSIS — I10 Essential (primary) hypertension: Secondary | ICD-10-CM | POA: Diagnosis not present

## 2018-03-18 DIAGNOSIS — N390 Urinary tract infection, site not specified: Secondary | ICD-10-CM | POA: Diagnosis not present

## 2018-03-18 DIAGNOSIS — R2689 Other abnormalities of gait and mobility: Secondary | ICD-10-CM | POA: Diagnosis not present

## 2018-03-18 DIAGNOSIS — M6281 Muscle weakness (generalized): Secondary | ICD-10-CM | POA: Diagnosis not present

## 2018-03-18 DIAGNOSIS — E7849 Other hyperlipidemia: Secondary | ICD-10-CM | POA: Diagnosis not present

## 2018-03-18 DIAGNOSIS — I639 Cerebral infarction, unspecified: Secondary | ICD-10-CM | POA: Diagnosis not present

## 2018-03-19 DIAGNOSIS — N390 Urinary tract infection, site not specified: Secondary | ICD-10-CM | POA: Diagnosis not present

## 2018-03-19 DIAGNOSIS — E7849 Other hyperlipidemia: Secondary | ICD-10-CM | POA: Diagnosis not present

## 2018-03-19 DIAGNOSIS — I6389 Other cerebral infarction: Secondary | ICD-10-CM | POA: Diagnosis not present

## 2018-03-19 DIAGNOSIS — I10 Essential (primary) hypertension: Secondary | ICD-10-CM | POA: Diagnosis not present

## 2018-03-23 ENCOUNTER — Other Ambulatory Visit: Payer: Self-pay | Admitting: *Deleted
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
## 2018-03-28 ENCOUNTER — Other Ambulatory Visit: Payer: Self-pay | Admitting: *Deleted
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
## 2018-04-07 DIAGNOSIS — I69311 Memory deficit following cerebral infarction: Secondary | ICD-10-CM | POA: Diagnosis not present

## 2018-04-07 DIAGNOSIS — I482 Chronic atrial fibrillation, unspecified: Secondary | ICD-10-CM | POA: Diagnosis not present

## 2018-04-07 DIAGNOSIS — Z7902 Long term (current) use of antithrombotics/antiplatelets: Secondary | ICD-10-CM | POA: Diagnosis not present

## 2018-04-07 DIAGNOSIS — M6281 Muscle weakness (generalized): Secondary | ICD-10-CM | POA: Diagnosis not present

## 2018-04-07 DIAGNOSIS — F329 Major depressive disorder, single episode, unspecified: Secondary | ICD-10-CM | POA: Diagnosis not present

## 2018-04-07 DIAGNOSIS — F4321 Adjustment disorder with depressed mood: Secondary | ICD-10-CM | POA: Diagnosis not present

## 2018-04-07 DIAGNOSIS — Z87891 Personal history of nicotine dependence: Secondary | ICD-10-CM | POA: Diagnosis not present

## 2018-04-07 DIAGNOSIS — I10 Essential (primary) hypertension: Secondary | ICD-10-CM | POA: Diagnosis not present

## 2018-04-11 DIAGNOSIS — K210 Gastro-esophageal reflux disease with esophagitis: Secondary | ICD-10-CM | POA: Diagnosis not present

## 2018-04-11 DIAGNOSIS — E785 Hyperlipidemia, unspecified: Secondary | ICD-10-CM | POA: Diagnosis not present

## 2018-04-11 DIAGNOSIS — F330 Major depressive disorder, recurrent, mild: Secondary | ICD-10-CM | POA: Diagnosis not present

## 2018-04-11 DIAGNOSIS — I10 Essential (primary) hypertension: Secondary | ICD-10-CM | POA: Diagnosis not present

## 2018-04-11 DIAGNOSIS — Z6824 Body mass index (BMI) 24.0-24.9, adult: Secondary | ICD-10-CM | POA: Diagnosis not present

## 2018-04-11 DIAGNOSIS — I639 Cerebral infarction, unspecified: Secondary | ICD-10-CM | POA: Diagnosis not present

## 2018-04-12 DIAGNOSIS — F329 Major depressive disorder, single episode, unspecified: Secondary | ICD-10-CM | POA: Diagnosis not present

## 2018-04-12 DIAGNOSIS — Z7902 Long term (current) use of antithrombotics/antiplatelets: Secondary | ICD-10-CM | POA: Diagnosis not present

## 2018-04-12 DIAGNOSIS — I10 Essential (primary) hypertension: Secondary | ICD-10-CM | POA: Diagnosis not present

## 2018-04-12 DIAGNOSIS — F4321 Adjustment disorder with depressed mood: Secondary | ICD-10-CM | POA: Diagnosis not present

## 2018-04-12 DIAGNOSIS — Z87891 Personal history of nicotine dependence: Secondary | ICD-10-CM | POA: Diagnosis not present

## 2018-04-12 DIAGNOSIS — I69311 Memory deficit following cerebral infarction: Secondary | ICD-10-CM | POA: Diagnosis not present

## 2018-04-12 DIAGNOSIS — M6281 Muscle weakness (generalized): Secondary | ICD-10-CM | POA: Diagnosis not present

## 2018-04-12 DIAGNOSIS — I482 Chronic atrial fibrillation, unspecified: Secondary | ICD-10-CM | POA: Diagnosis not present

## 2018-04-13 DIAGNOSIS — I482 Chronic atrial fibrillation, unspecified: Secondary | ICD-10-CM | POA: Diagnosis not present

## 2018-04-13 DIAGNOSIS — I69311 Memory deficit following cerebral infarction: Secondary | ICD-10-CM | POA: Diagnosis not present

## 2018-04-13 DIAGNOSIS — F4321 Adjustment disorder with depressed mood: Secondary | ICD-10-CM | POA: Diagnosis not present

## 2018-04-13 DIAGNOSIS — I10 Essential (primary) hypertension: Secondary | ICD-10-CM | POA: Diagnosis not present

## 2018-04-13 DIAGNOSIS — M6281 Muscle weakness (generalized): Secondary | ICD-10-CM | POA: Diagnosis not present

## 2018-04-13 DIAGNOSIS — Z7902 Long term (current) use of antithrombotics/antiplatelets: Secondary | ICD-10-CM | POA: Diagnosis not present

## 2018-04-13 DIAGNOSIS — F329 Major depressive disorder, single episode, unspecified: Secondary | ICD-10-CM | POA: Diagnosis not present

## 2018-04-13 DIAGNOSIS — Z87891 Personal history of nicotine dependence: Secondary | ICD-10-CM | POA: Diagnosis not present

## 2018-04-15 DIAGNOSIS — Z87891 Personal history of nicotine dependence: Secondary | ICD-10-CM | POA: Diagnosis not present

## 2018-04-15 DIAGNOSIS — I10 Essential (primary) hypertension: Secondary | ICD-10-CM | POA: Diagnosis not present

## 2018-04-15 DIAGNOSIS — F329 Major depressive disorder, single episode, unspecified: Secondary | ICD-10-CM | POA: Diagnosis not present

## 2018-04-15 DIAGNOSIS — Z7902 Long term (current) use of antithrombotics/antiplatelets: Secondary | ICD-10-CM | POA: Diagnosis not present

## 2018-04-15 DIAGNOSIS — I69311 Memory deficit following cerebral infarction: Secondary | ICD-10-CM | POA: Diagnosis not present

## 2018-04-15 DIAGNOSIS — I482 Chronic atrial fibrillation, unspecified: Secondary | ICD-10-CM | POA: Diagnosis not present

## 2018-04-15 DIAGNOSIS — F4321 Adjustment disorder with depressed mood: Secondary | ICD-10-CM | POA: Diagnosis not present

## 2018-04-15 DIAGNOSIS — M6281 Muscle weakness (generalized): Secondary | ICD-10-CM | POA: Diagnosis not present

## 2018-04-19 DIAGNOSIS — I10 Essential (primary) hypertension: Secondary | ICD-10-CM | POA: Diagnosis not present

## 2018-04-19 DIAGNOSIS — I482 Chronic atrial fibrillation, unspecified: Secondary | ICD-10-CM | POA: Diagnosis not present

## 2018-04-19 DIAGNOSIS — Z7902 Long term (current) use of antithrombotics/antiplatelets: Secondary | ICD-10-CM | POA: Diagnosis not present

## 2018-04-19 DIAGNOSIS — F329 Major depressive disorder, single episode, unspecified: Secondary | ICD-10-CM | POA: Diagnosis not present

## 2018-04-19 DIAGNOSIS — F4321 Adjustment disorder with depressed mood: Secondary | ICD-10-CM | POA: Diagnosis not present

## 2018-04-19 DIAGNOSIS — M6281 Muscle weakness (generalized): Secondary | ICD-10-CM | POA: Diagnosis not present

## 2018-04-19 DIAGNOSIS — I69311 Memory deficit following cerebral infarction: Secondary | ICD-10-CM | POA: Diagnosis not present

## 2018-04-19 DIAGNOSIS — Z87891 Personal history of nicotine dependence: Secondary | ICD-10-CM | POA: Diagnosis not present

## 2018-04-22 DIAGNOSIS — I482 Chronic atrial fibrillation, unspecified: Secondary | ICD-10-CM | POA: Diagnosis not present

## 2018-04-22 DIAGNOSIS — M6281 Muscle weakness (generalized): Secondary | ICD-10-CM | POA: Diagnosis not present

## 2018-04-22 DIAGNOSIS — F329 Major depressive disorder, single episode, unspecified: Secondary | ICD-10-CM | POA: Diagnosis not present

## 2018-04-22 DIAGNOSIS — I10 Essential (primary) hypertension: Secondary | ICD-10-CM | POA: Diagnosis not present

## 2018-04-22 DIAGNOSIS — Z7902 Long term (current) use of antithrombotics/antiplatelets: Secondary | ICD-10-CM | POA: Diagnosis not present

## 2018-04-22 DIAGNOSIS — Z87891 Personal history of nicotine dependence: Secondary | ICD-10-CM | POA: Diagnosis not present

## 2018-04-22 DIAGNOSIS — I69311 Memory deficit following cerebral infarction: Secondary | ICD-10-CM | POA: Diagnosis not present

## 2018-04-22 DIAGNOSIS — F4321 Adjustment disorder with depressed mood: Secondary | ICD-10-CM | POA: Diagnosis not present

## 2018-04-28 DIAGNOSIS — Z87891 Personal history of nicotine dependence: Secondary | ICD-10-CM | POA: Diagnosis not present

## 2018-04-28 DIAGNOSIS — F329 Major depressive disorder, single episode, unspecified: Secondary | ICD-10-CM | POA: Diagnosis not present

## 2018-04-28 DIAGNOSIS — I69311 Memory deficit following cerebral infarction: Secondary | ICD-10-CM | POA: Diagnosis not present

## 2018-04-28 DIAGNOSIS — I482 Chronic atrial fibrillation, unspecified: Secondary | ICD-10-CM | POA: Diagnosis not present

## 2018-04-28 DIAGNOSIS — Z7902 Long term (current) use of antithrombotics/antiplatelets: Secondary | ICD-10-CM | POA: Diagnosis not present

## 2018-04-28 DIAGNOSIS — F4321 Adjustment disorder with depressed mood: Secondary | ICD-10-CM | POA: Diagnosis not present

## 2018-04-28 DIAGNOSIS — I10 Essential (primary) hypertension: Secondary | ICD-10-CM | POA: Diagnosis not present

## 2018-04-28 DIAGNOSIS — M6281 Muscle weakness (generalized): Secondary | ICD-10-CM | POA: Diagnosis not present

## 2018-05-02 DIAGNOSIS — Z87891 Personal history of nicotine dependence: Secondary | ICD-10-CM | POA: Diagnosis not present

## 2018-05-02 DIAGNOSIS — F329 Major depressive disorder, single episode, unspecified: Secondary | ICD-10-CM | POA: Diagnosis not present

## 2018-05-02 DIAGNOSIS — Z7902 Long term (current) use of antithrombotics/antiplatelets: Secondary | ICD-10-CM | POA: Diagnosis not present

## 2018-05-02 DIAGNOSIS — I482 Chronic atrial fibrillation, unspecified: Secondary | ICD-10-CM | POA: Diagnosis not present

## 2018-05-02 DIAGNOSIS — M6281 Muscle weakness (generalized): Secondary | ICD-10-CM | POA: Diagnosis not present

## 2018-05-02 DIAGNOSIS — F4321 Adjustment disorder with depressed mood: Secondary | ICD-10-CM | POA: Diagnosis not present

## 2018-05-02 DIAGNOSIS — I69311 Memory deficit following cerebral infarction: Secondary | ICD-10-CM | POA: Diagnosis not present

## 2018-05-02 DIAGNOSIS — I10 Essential (primary) hypertension: Secondary | ICD-10-CM | POA: Diagnosis not present

## 2018-05-18 DIAGNOSIS — B370 Candidal stomatitis: Secondary | ICD-10-CM | POA: Diagnosis not present

## 2018-05-18 DIAGNOSIS — R319 Hematuria, unspecified: Secondary | ICD-10-CM | POA: Diagnosis not present

## 2018-05-18 DIAGNOSIS — R300 Dysuria: Secondary | ICD-10-CM | POA: Diagnosis not present

## 2018-05-18 DIAGNOSIS — N390 Urinary tract infection, site not specified: Secondary | ICD-10-CM | POA: Diagnosis not present

## 2018-05-18 DIAGNOSIS — L309 Dermatitis, unspecified: Secondary | ICD-10-CM | POA: Diagnosis not present

## 2018-05-25 DIAGNOSIS — Z87891 Personal history of nicotine dependence: Secondary | ICD-10-CM | POA: Diagnosis not present

## 2018-05-25 DIAGNOSIS — I482 Chronic atrial fibrillation, unspecified: Secondary | ICD-10-CM | POA: Diagnosis not present

## 2018-05-25 DIAGNOSIS — F4321 Adjustment disorder with depressed mood: Secondary | ICD-10-CM | POA: Diagnosis not present

## 2018-05-25 DIAGNOSIS — I10 Essential (primary) hypertension: Secondary | ICD-10-CM | POA: Diagnosis not present

## 2018-05-25 DIAGNOSIS — Z7902 Long term (current) use of antithrombotics/antiplatelets: Secondary | ICD-10-CM | POA: Diagnosis not present

## 2018-05-25 DIAGNOSIS — I69311 Memory deficit following cerebral infarction: Secondary | ICD-10-CM | POA: Diagnosis not present

## 2018-05-25 DIAGNOSIS — F329 Major depressive disorder, single episode, unspecified: Secondary | ICD-10-CM | POA: Diagnosis not present

## 2018-05-25 DIAGNOSIS — M6281 Muscle weakness (generalized): Secondary | ICD-10-CM | POA: Diagnosis not present

## 2018-06-16 DIAGNOSIS — K210 Gastro-esophageal reflux disease with esophagitis: Secondary | ICD-10-CM | POA: Diagnosis not present

## 2018-06-16 DIAGNOSIS — Z6824 Body mass index (BMI) 24.0-24.9, adult: Secondary | ICD-10-CM | POA: Diagnosis not present

## 2018-06-16 DIAGNOSIS — I10 Essential (primary) hypertension: Secondary | ICD-10-CM | POA: Diagnosis not present

## 2018-06-16 DIAGNOSIS — I639 Cerebral infarction, unspecified: Secondary | ICD-10-CM | POA: Diagnosis not present

## 2018-06-16 DIAGNOSIS — F330 Major depressive disorder, recurrent, mild: Secondary | ICD-10-CM | POA: Diagnosis not present

## 2018-06-16 DIAGNOSIS — E785 Hyperlipidemia, unspecified: Secondary | ICD-10-CM | POA: Diagnosis not present

## 2018-07-03 DIAGNOSIS — R531 Weakness: Secondary | ICD-10-CM | POA: Diagnosis not present

## 2018-07-03 DIAGNOSIS — N3000 Acute cystitis without hematuria: Secondary | ICD-10-CM | POA: Diagnosis not present

## 2018-07-03 DIAGNOSIS — Z7982 Long term (current) use of aspirin: Secondary | ICD-10-CM | POA: Diagnosis not present

## 2018-07-03 DIAGNOSIS — F17200 Nicotine dependence, unspecified, uncomplicated: Secondary | ICD-10-CM | POA: Diagnosis not present

## 2018-07-03 DIAGNOSIS — R251 Tremor, unspecified: Secondary | ICD-10-CM | POA: Diagnosis not present

## 2018-07-03 DIAGNOSIS — R9082 White matter disease, unspecified: Secondary | ICD-10-CM | POA: Diagnosis not present

## 2018-07-12 DIAGNOSIS — R5381 Other malaise: Secondary | ICD-10-CM | POA: Diagnosis not present

## 2018-07-12 DIAGNOSIS — M79601 Pain in right arm: Secondary | ICD-10-CM | POA: Diagnosis not present

## 2018-07-12 DIAGNOSIS — S52501A Unspecified fracture of the lower end of right radius, initial encounter for closed fracture: Secondary | ICD-10-CM | POA: Diagnosis not present

## 2018-07-12 DIAGNOSIS — S0990XA Unspecified injury of head, initial encounter: Secondary | ICD-10-CM | POA: Diagnosis not present

## 2018-07-12 DIAGNOSIS — W19XXXA Unspecified fall, initial encounter: Secondary | ICD-10-CM | POA: Diagnosis not present

## 2018-07-12 DIAGNOSIS — F0150 Vascular dementia without behavioral disturbance: Secondary | ICD-10-CM | POA: Diagnosis not present

## 2018-07-12 DIAGNOSIS — S52135A Nondisplaced fracture of neck of left radius, initial encounter for closed fracture: Secondary | ICD-10-CM | POA: Diagnosis not present

## 2018-07-12 DIAGNOSIS — I10 Essential (primary) hypertension: Secondary | ICD-10-CM | POA: Diagnosis not present

## 2018-07-12 DIAGNOSIS — Z23 Encounter for immunization: Secondary | ICD-10-CM | POA: Diagnosis not present

## 2018-07-12 DIAGNOSIS — Z8673 Personal history of transient ischemic attack (TIA), and cerebral infarction without residual deficits: Secondary | ICD-10-CM | POA: Diagnosis not present

## 2018-07-12 DIAGNOSIS — W1830XA Fall on same level, unspecified, initial encounter: Secondary | ICD-10-CM | POA: Diagnosis not present

## 2018-07-12 DIAGNOSIS — S52612A Displaced fracture of left ulna styloid process, initial encounter for closed fracture: Secondary | ICD-10-CM | POA: Diagnosis not present

## 2018-07-12 DIAGNOSIS — E785 Hyperlipidemia, unspecified: Secondary | ICD-10-CM | POA: Diagnosis not present

## 2018-07-12 DIAGNOSIS — S52502A Unspecified fracture of the lower end of left radius, initial encounter for closed fracture: Secondary | ICD-10-CM | POA: Diagnosis not present

## 2018-07-12 DIAGNOSIS — S52611A Displaced fracture of right ulna styloid process, initial encounter for closed fracture: Secondary | ICD-10-CM | POA: Diagnosis not present

## 2018-07-12 DIAGNOSIS — S52614A Nondisplaced fracture of right ulna styloid process, initial encounter for closed fracture: Secondary | ICD-10-CM | POA: Diagnosis not present

## 2018-07-12 DIAGNOSIS — F329 Major depressive disorder, single episode, unspecified: Secondary | ICD-10-CM | POA: Diagnosis not present

## 2018-07-12 DIAGNOSIS — S59901A Unspecified injury of right elbow, initial encounter: Secondary | ICD-10-CM | POA: Diagnosis not present

## 2018-07-13 DIAGNOSIS — S52135A Nondisplaced fracture of neck of left radius, initial encounter for closed fracture: Secondary | ICD-10-CM | POA: Diagnosis not present

## 2018-07-13 DIAGNOSIS — S52502A Unspecified fracture of the lower end of left radius, initial encounter for closed fracture: Secondary | ICD-10-CM | POA: Diagnosis not present

## 2018-07-13 DIAGNOSIS — W1830XA Fall on same level, unspecified, initial encounter: Secondary | ICD-10-CM | POA: Diagnosis not present

## 2018-07-13 DIAGNOSIS — S52501A Unspecified fracture of the lower end of right radius, initial encounter for closed fracture: Secondary | ICD-10-CM | POA: Diagnosis not present

## 2018-07-13 DIAGNOSIS — S52611A Displaced fracture of right ulna styloid process, initial encounter for closed fracture: Secondary | ICD-10-CM | POA: Diagnosis not present

## 2018-07-13 DIAGNOSIS — S52612A Displaced fracture of left ulna styloid process, initial encounter for closed fracture: Secondary | ICD-10-CM | POA: Diagnosis not present

## 2018-07-16 DIAGNOSIS — F329 Major depressive disorder, single episode, unspecified: Secondary | ICD-10-CM | POA: Diagnosis not present

## 2018-07-16 DIAGNOSIS — S52501D Unspecified fracture of the lower end of right radius, subsequent encounter for closed fracture with routine healing: Secondary | ICD-10-CM | POA: Diagnosis not present

## 2018-07-16 DIAGNOSIS — S52611A Displaced fracture of right ulna styloid process, initial encounter for closed fracture: Secondary | ICD-10-CM | POA: Diagnosis not present

## 2018-07-16 DIAGNOSIS — M6281 Muscle weakness (generalized): Secondary | ICD-10-CM | POA: Diagnosis not present

## 2018-07-16 DIAGNOSIS — F0150 Vascular dementia without behavioral disturbance: Secondary | ICD-10-CM | POA: Diagnosis not present

## 2018-07-16 DIAGNOSIS — E785 Hyperlipidemia, unspecified: Secondary | ICD-10-CM | POA: Diagnosis not present

## 2018-07-16 DIAGNOSIS — S52511D Displaced fracture of right radial styloid process, subsequent encounter for closed fracture with routine healing: Secondary | ICD-10-CM | POA: Diagnosis not present

## 2018-07-16 DIAGNOSIS — R2689 Other abnormalities of gait and mobility: Secondary | ICD-10-CM | POA: Diagnosis not present

## 2018-07-16 DIAGNOSIS — I639 Cerebral infarction, unspecified: Secondary | ICD-10-CM | POA: Diagnosis not present

## 2018-07-16 DIAGNOSIS — Z23 Encounter for immunization: Secondary | ICD-10-CM | POA: Diagnosis not present

## 2018-07-16 DIAGNOSIS — Z8673 Personal history of transient ischemic attack (TIA), and cerebral infarction without residual deficits: Secondary | ICD-10-CM | POA: Diagnosis not present

## 2018-07-16 DIAGNOSIS — S52501A Unspecified fracture of the lower end of right radius, initial encounter for closed fracture: Secondary | ICD-10-CM | POA: Diagnosis not present

## 2018-07-16 DIAGNOSIS — S52502D Unspecified fracture of the lower end of left radius, subsequent encounter for closed fracture with routine healing: Secondary | ICD-10-CM | POA: Diagnosis not present

## 2018-07-16 DIAGNOSIS — S52612A Displaced fracture of left ulna styloid process, initial encounter for closed fracture: Secondary | ICD-10-CM | POA: Diagnosis not present

## 2018-07-16 DIAGNOSIS — R1312 Dysphagia, oropharyngeal phase: Secondary | ICD-10-CM | POA: Diagnosis not present

## 2018-07-16 DIAGNOSIS — S52502A Unspecified fracture of the lower end of left radius, initial encounter for closed fracture: Secondary | ICD-10-CM | POA: Diagnosis not present

## 2018-07-16 DIAGNOSIS — S52614D Nondisplaced fracture of right ulna styloid process, subsequent encounter for closed fracture with routine healing: Secondary | ICD-10-CM | POA: Diagnosis not present

## 2018-07-16 DIAGNOSIS — R41841 Cognitive communication deficit: Secondary | ICD-10-CM | POA: Diagnosis not present

## 2018-07-16 DIAGNOSIS — Z9181 History of falling: Secondary | ICD-10-CM | POA: Diagnosis not present

## 2018-07-16 DIAGNOSIS — I10 Essential (primary) hypertension: Secondary | ICD-10-CM | POA: Diagnosis not present

## 2018-07-16 DIAGNOSIS — W1830XA Fall on same level, unspecified, initial encounter: Secondary | ICD-10-CM | POA: Diagnosis not present

## 2018-08-11 DIAGNOSIS — S52501D Unspecified fracture of the lower end of right radius, subsequent encounter for closed fracture with routine healing: Secondary | ICD-10-CM | POA: Diagnosis not present

## 2018-08-11 DIAGNOSIS — S52502A Unspecified fracture of the lower end of left radius, initial encounter for closed fracture: Secondary | ICD-10-CM | POA: Diagnosis not present

## 2018-08-11 DIAGNOSIS — S52614D Nondisplaced fracture of right ulna styloid process, subsequent encounter for closed fracture with routine healing: Secondary | ICD-10-CM | POA: Diagnosis not present

## 2018-09-09 ENCOUNTER — Other Ambulatory Visit: Payer: Self-pay

## 2018-09-09 DIAGNOSIS — I639 Cerebral infarction, unspecified: Secondary | ICD-10-CM | POA: Diagnosis not present

## 2018-09-09 DIAGNOSIS — E785 Hyperlipidemia, unspecified: Secondary | ICD-10-CM | POA: Diagnosis not present

## 2018-09-09 DIAGNOSIS — I10 Essential (primary) hypertension: Secondary | ICD-10-CM | POA: Diagnosis not present

## 2018-09-09 NOTE — Patient Outreach (Signed)
[facility]) Care Management  09/09/2018  Morgan Estrada 1941-04-05 [id number]   Referral Date: 09/09/2018 Referral Source: [employer] Report Date of Admission: unknown Diagnosis:  Right Ulna Fracture Date of Discharge: 09/07/2018 Facility: [facility] Insurance:  [facility]  Outreach attempt: No answer.  Unable to leave a message.    Plan: RN CM will attempt again within 4 business days and send letter.     Bary Leriche, RN, MSN [facility] Care Management Care Management Coordinator Direct Line [phone] Toll Free: [phone]  Fax: [phone]

## 2018-09-13 ENCOUNTER — Other Ambulatory Visit: Payer: Self-pay

## 2018-09-13 NOTE — Patient Outreach (Signed)
[facility]) Care Management  09/13/2018  TAHEERA GURRERO 1941-01-06 [id number]   Referral Date: 09/09/2018 Referral Source: [employer] Report Date of Admission: unknown Diagnosis:  Right Ulna Fracture Date of Discharge: 09/07/2018 Facility: [facility] Insurance:  [facility]  Outreach attempt: No answer.  Unable to leave a message.    Plan: RN CM will attempt again within 4 business days.  Bary Leriche, RN, MSN [facility] Care Management Care Management Coordinator Direct Line [phone] Cell [phone] Toll Free: [phone]  Fax: [phone]

## 2018-09-15 ENCOUNTER — Other Ambulatory Visit: Payer: Self-pay

## 2018-09-15 NOTE — Patient Outreach (Signed)
[facility]) Care Management  09/15/2018  HAILA HINIKER 14-Jul-1940 [id number]   Referral Date:09/09/2018 Referral Source:[employer] Report Date of Admission:unknown Diagnosis:Right Ulna Fracture Date of Discharge:09/07/2018 Facility:[facility] Insurance:[employer]  Outreach attempt:No answer. Unable to leave a message.   Plan: RN CM willwait return call.  If no return call will close case.    Bary Leriche, RN, MSN [facility] Care Management Care Management Coordinator Direct Line [phone] Cell [phone] Toll Free: [phone]  Fax: [phone]

## 2018-09-23 ENCOUNTER — Other Ambulatory Visit: Payer: Self-pay

## 2018-09-23 NOTE — Patient Outreach (Signed)
[facility]) Care Management  09/23/2018  Morgan Estrada 02/06/1941 [id number]   Multiple attempts to establish contact with patient without success. No response from letter mailed to patient.   Plan: RN CM will close case at this time.   Bary Leriche, RN, MSN [facility] Care Management Care Management Coordinator Direct Line [phone] Cell [phone] Toll Free: [phone]  Fax: [phone]

## 2018-10-03 DIAGNOSIS — Z7982 Long term (current) use of aspirin: Secondary | ICD-10-CM | POA: Diagnosis not present

## 2018-10-03 DIAGNOSIS — Z7902 Long term (current) use of antithrombotics/antiplatelets: Secondary | ICD-10-CM | POA: Diagnosis not present

## 2018-10-03 DIAGNOSIS — Z79899 Other long term (current) drug therapy: Secondary | ICD-10-CM | POA: Diagnosis not present

## 2018-10-03 DIAGNOSIS — Z5181 Encounter for therapeutic drug level monitoring: Secondary | ICD-10-CM | POA: Diagnosis not present

## 2018-10-03 DIAGNOSIS — R079 Chest pain, unspecified: Secondary | ICD-10-CM | POA: Diagnosis not present

## 2018-10-12 DIAGNOSIS — E785 Hyperlipidemia, unspecified: Secondary | ICD-10-CM | POA: Diagnosis not present

## 2018-10-12 DIAGNOSIS — I10 Essential (primary) hypertension: Secondary | ICD-10-CM | POA: Diagnosis not present

## 2018-10-12 DIAGNOSIS — I639 Cerebral infarction, unspecified: Secondary | ICD-10-CM | POA: Diagnosis not present

## 2018-10-28 DIAGNOSIS — M25552 Pain in left hip: Secondary | ICD-10-CM | POA: Diagnosis not present

## 2018-10-28 DIAGNOSIS — M79652 Pain in left thigh: Secondary | ICD-10-CM | POA: Diagnosis not present

## 2018-10-28 DIAGNOSIS — M25551 Pain in right hip: Secondary | ICD-10-CM | POA: Diagnosis not present

## 2018-10-28 DIAGNOSIS — M79651 Pain in right thigh: Secondary | ICD-10-CM | POA: Diagnosis not present

## 2018-11-11 DIAGNOSIS — I639 Cerebral infarction, unspecified: Secondary | ICD-10-CM | POA: Diagnosis not present

## 2018-11-11 DIAGNOSIS — E785 Hyperlipidemia, unspecified: Secondary | ICD-10-CM | POA: Diagnosis not present

## 2018-11-11 DIAGNOSIS — I10 Essential (primary) hypertension: Secondary | ICD-10-CM | POA: Diagnosis not present

## 2018-12-15 DIAGNOSIS — I10 Essential (primary) hypertension: Secondary | ICD-10-CM | POA: Diagnosis not present

## 2018-12-15 DIAGNOSIS — I639 Cerebral infarction, unspecified: Secondary | ICD-10-CM | POA: Diagnosis not present

## 2018-12-15 DIAGNOSIS — E785 Hyperlipidemia, unspecified: Secondary | ICD-10-CM | POA: Diagnosis not present

## 2018-12-28 DIAGNOSIS — F0150 Vascular dementia without behavioral disturbance: Secondary | ICD-10-CM | POA: Diagnosis not present

## 2018-12-28 DIAGNOSIS — M6281 Muscle weakness (generalized): Secondary | ICD-10-CM | POA: Diagnosis not present

## 2018-12-28 DIAGNOSIS — R41841 Cognitive communication deficit: Secondary | ICD-10-CM | POA: Diagnosis not present

## 2018-12-28 DIAGNOSIS — R2689 Other abnormalities of gait and mobility: Secondary | ICD-10-CM | POA: Diagnosis not present

## 2018-12-29 DIAGNOSIS — M6281 Muscle weakness (generalized): Secondary | ICD-10-CM | POA: Diagnosis not present

## 2018-12-29 DIAGNOSIS — R2689 Other abnormalities of gait and mobility: Secondary | ICD-10-CM | POA: Diagnosis not present

## 2018-12-29 DIAGNOSIS — F0150 Vascular dementia without behavioral disturbance: Secondary | ICD-10-CM | POA: Diagnosis not present

## 2018-12-29 DIAGNOSIS — R41841 Cognitive communication deficit: Secondary | ICD-10-CM | POA: Diagnosis not present

## 2018-12-30 DIAGNOSIS — R2689 Other abnormalities of gait and mobility: Secondary | ICD-10-CM | POA: Diagnosis not present

## 2018-12-30 DIAGNOSIS — R41841 Cognitive communication deficit: Secondary | ICD-10-CM | POA: Diagnosis not present

## 2018-12-30 DIAGNOSIS — F0150 Vascular dementia without behavioral disturbance: Secondary | ICD-10-CM | POA: Diagnosis not present

## 2018-12-30 DIAGNOSIS — M6281 Muscle weakness (generalized): Secondary | ICD-10-CM | POA: Diagnosis not present

## 2019-01-02 DIAGNOSIS — R2689 Other abnormalities of gait and mobility: Secondary | ICD-10-CM | POA: Diagnosis not present

## 2019-01-02 DIAGNOSIS — R41841 Cognitive communication deficit: Secondary | ICD-10-CM | POA: Diagnosis not present

## 2019-01-02 DIAGNOSIS — F0150 Vascular dementia without behavioral disturbance: Secondary | ICD-10-CM | POA: Diagnosis not present

## 2019-01-02 DIAGNOSIS — M6281 Muscle weakness (generalized): Secondary | ICD-10-CM | POA: Diagnosis not present

## 2019-01-03 DIAGNOSIS — F0150 Vascular dementia without behavioral disturbance: Secondary | ICD-10-CM | POA: Diagnosis not present

## 2019-01-03 DIAGNOSIS — R41841 Cognitive communication deficit: Secondary | ICD-10-CM | POA: Diagnosis not present

## 2019-01-03 DIAGNOSIS — R2689 Other abnormalities of gait and mobility: Secondary | ICD-10-CM | POA: Diagnosis not present

## 2019-01-03 DIAGNOSIS — M6281 Muscle weakness (generalized): Secondary | ICD-10-CM | POA: Diagnosis not present

## 2019-01-04 DIAGNOSIS — M6281 Muscle weakness (generalized): Secondary | ICD-10-CM | POA: Diagnosis not present

## 2019-01-04 DIAGNOSIS — Z20828 Contact with and (suspected) exposure to other viral communicable diseases: Secondary | ICD-10-CM | POA: Diagnosis not present

## 2019-01-04 DIAGNOSIS — R2689 Other abnormalities of gait and mobility: Secondary | ICD-10-CM | POA: Diagnosis not present

## 2019-01-04 DIAGNOSIS — R41841 Cognitive communication deficit: Secondary | ICD-10-CM | POA: Diagnosis not present

## 2019-01-04 DIAGNOSIS — F0150 Vascular dementia without behavioral disturbance: Secondary | ICD-10-CM | POA: Diagnosis not present

## 2019-01-05 DIAGNOSIS — M6281 Muscle weakness (generalized): Secondary | ICD-10-CM | POA: Diagnosis not present

## 2019-01-05 DIAGNOSIS — R2689 Other abnormalities of gait and mobility: Secondary | ICD-10-CM | POA: Diagnosis not present

## 2019-01-05 DIAGNOSIS — F0150 Vascular dementia without behavioral disturbance: Secondary | ICD-10-CM | POA: Diagnosis not present

## 2019-01-05 DIAGNOSIS — R41841 Cognitive communication deficit: Secondary | ICD-10-CM | POA: Diagnosis not present

## 2019-01-06 DIAGNOSIS — R2689 Other abnormalities of gait and mobility: Secondary | ICD-10-CM | POA: Diagnosis not present

## 2019-01-06 DIAGNOSIS — M6281 Muscle weakness (generalized): Secondary | ICD-10-CM | POA: Diagnosis not present

## 2019-01-06 DIAGNOSIS — R41841 Cognitive communication deficit: Secondary | ICD-10-CM | POA: Diagnosis not present

## 2019-01-06 DIAGNOSIS — F0150 Vascular dementia without behavioral disturbance: Secondary | ICD-10-CM | POA: Diagnosis not present

## 2019-01-07 DIAGNOSIS — R41841 Cognitive communication deficit: Secondary | ICD-10-CM | POA: Diagnosis not present

## 2019-01-07 DIAGNOSIS — F0150 Vascular dementia without behavioral disturbance: Secondary | ICD-10-CM | POA: Diagnosis not present

## 2019-01-07 DIAGNOSIS — R2689 Other abnormalities of gait and mobility: Secondary | ICD-10-CM | POA: Diagnosis not present

## 2019-01-07 DIAGNOSIS — M6281 Muscle weakness (generalized): Secondary | ICD-10-CM | POA: Diagnosis not present

## 2019-01-09 DIAGNOSIS — F0150 Vascular dementia without behavioral disturbance: Secondary | ICD-10-CM | POA: Diagnosis not present

## 2019-01-09 DIAGNOSIS — R41841 Cognitive communication deficit: Secondary | ICD-10-CM | POA: Diagnosis not present

## 2019-01-09 DIAGNOSIS — R2689 Other abnormalities of gait and mobility: Secondary | ICD-10-CM | POA: Diagnosis not present

## 2019-01-09 DIAGNOSIS — M6281 Muscle weakness (generalized): Secondary | ICD-10-CM | POA: Diagnosis not present

## 2019-01-10 DIAGNOSIS — M6281 Muscle weakness (generalized): Secondary | ICD-10-CM | POA: Diagnosis not present

## 2019-01-10 DIAGNOSIS — F0150 Vascular dementia without behavioral disturbance: Secondary | ICD-10-CM | POA: Diagnosis not present

## 2019-01-10 DIAGNOSIS — R2689 Other abnormalities of gait and mobility: Secondary | ICD-10-CM | POA: Diagnosis not present

## 2019-01-10 DIAGNOSIS — R41841 Cognitive communication deficit: Secondary | ICD-10-CM | POA: Diagnosis not present

## 2019-01-11 DIAGNOSIS — R41841 Cognitive communication deficit: Secondary | ICD-10-CM | POA: Diagnosis not present

## 2019-01-11 DIAGNOSIS — M6281 Muscle weakness (generalized): Secondary | ICD-10-CM | POA: Diagnosis not present

## 2019-01-11 DIAGNOSIS — F0150 Vascular dementia without behavioral disturbance: Secondary | ICD-10-CM | POA: Diagnosis not present

## 2019-01-11 DIAGNOSIS — R2689 Other abnormalities of gait and mobility: Secondary | ICD-10-CM | POA: Diagnosis not present

## 2019-01-11 DIAGNOSIS — Z20828 Contact with and (suspected) exposure to other viral communicable diseases: Secondary | ICD-10-CM | POA: Diagnosis not present

## 2019-01-12 DIAGNOSIS — R2689 Other abnormalities of gait and mobility: Secondary | ICD-10-CM | POA: Diagnosis not present

## 2019-01-12 DIAGNOSIS — M6281 Muscle weakness (generalized): Secondary | ICD-10-CM | POA: Diagnosis not present

## 2019-01-12 DIAGNOSIS — F0150 Vascular dementia without behavioral disturbance: Secondary | ICD-10-CM | POA: Diagnosis not present

## 2019-01-12 DIAGNOSIS — R41841 Cognitive communication deficit: Secondary | ICD-10-CM | POA: Diagnosis not present

## 2019-01-13 DIAGNOSIS — M6281 Muscle weakness (generalized): Secondary | ICD-10-CM | POA: Diagnosis not present

## 2019-01-13 DIAGNOSIS — R2689 Other abnormalities of gait and mobility: Secondary | ICD-10-CM | POA: Diagnosis not present

## 2019-01-13 DIAGNOSIS — F0150 Vascular dementia without behavioral disturbance: Secondary | ICD-10-CM | POA: Diagnosis not present

## 2019-01-13 DIAGNOSIS — R41841 Cognitive communication deficit: Secondary | ICD-10-CM | POA: Diagnosis not present

## 2019-01-14 DIAGNOSIS — M6281 Muscle weakness (generalized): Secondary | ICD-10-CM | POA: Diagnosis not present

## 2019-01-14 DIAGNOSIS — R41841 Cognitive communication deficit: Secondary | ICD-10-CM | POA: Diagnosis not present

## 2019-01-14 DIAGNOSIS — F0150 Vascular dementia without behavioral disturbance: Secondary | ICD-10-CM | POA: Diagnosis not present

## 2019-01-14 DIAGNOSIS — R2689 Other abnormalities of gait and mobility: Secondary | ICD-10-CM | POA: Diagnosis not present

## 2019-01-15 DIAGNOSIS — I10 Essential (primary) hypertension: Secondary | ICD-10-CM | POA: Diagnosis not present

## 2019-01-15 DIAGNOSIS — I639 Cerebral infarction, unspecified: Secondary | ICD-10-CM | POA: Diagnosis not present

## 2019-01-15 DIAGNOSIS — E785 Hyperlipidemia, unspecified: Secondary | ICD-10-CM | POA: Diagnosis not present

## 2019-01-16 DIAGNOSIS — R2689 Other abnormalities of gait and mobility: Secondary | ICD-10-CM | POA: Diagnosis not present

## 2019-01-16 DIAGNOSIS — F0150 Vascular dementia without behavioral disturbance: Secondary | ICD-10-CM | POA: Diagnosis not present

## 2019-01-16 DIAGNOSIS — M6281 Muscle weakness (generalized): Secondary | ICD-10-CM | POA: Diagnosis not present

## 2019-01-16 DIAGNOSIS — R41841 Cognitive communication deficit: Secondary | ICD-10-CM | POA: Diagnosis not present

## 2019-01-17 DIAGNOSIS — M6281 Muscle weakness (generalized): Secondary | ICD-10-CM | POA: Diagnosis not present

## 2019-01-17 DIAGNOSIS — R41841 Cognitive communication deficit: Secondary | ICD-10-CM | POA: Diagnosis not present

## 2019-01-17 DIAGNOSIS — F0150 Vascular dementia without behavioral disturbance: Secondary | ICD-10-CM | POA: Diagnosis not present

## 2019-01-17 DIAGNOSIS — R2689 Other abnormalities of gait and mobility: Secondary | ICD-10-CM | POA: Diagnosis not present

## 2019-01-18 DIAGNOSIS — R2689 Other abnormalities of gait and mobility: Secondary | ICD-10-CM | POA: Diagnosis not present

## 2019-01-18 DIAGNOSIS — F0150 Vascular dementia without behavioral disturbance: Secondary | ICD-10-CM | POA: Diagnosis not present

## 2019-01-18 DIAGNOSIS — Z20828 Contact with and (suspected) exposure to other viral communicable diseases: Secondary | ICD-10-CM | POA: Diagnosis not present

## 2019-01-18 DIAGNOSIS — R41841 Cognitive communication deficit: Secondary | ICD-10-CM | POA: Diagnosis not present

## 2019-01-18 DIAGNOSIS — M6281 Muscle weakness (generalized): Secondary | ICD-10-CM | POA: Diagnosis not present

## 2019-01-19 DIAGNOSIS — M6281 Muscle weakness (generalized): Secondary | ICD-10-CM | POA: Diagnosis not present

## 2019-01-19 DIAGNOSIS — F0150 Vascular dementia without behavioral disturbance: Secondary | ICD-10-CM | POA: Diagnosis not present

## 2019-01-19 DIAGNOSIS — R41841 Cognitive communication deficit: Secondary | ICD-10-CM | POA: Diagnosis not present

## 2019-01-19 DIAGNOSIS — R2689 Other abnormalities of gait and mobility: Secondary | ICD-10-CM | POA: Diagnosis not present

## 2019-01-20 DIAGNOSIS — R2689 Other abnormalities of gait and mobility: Secondary | ICD-10-CM | POA: Diagnosis not present

## 2019-01-20 DIAGNOSIS — F0150 Vascular dementia without behavioral disturbance: Secondary | ICD-10-CM | POA: Diagnosis not present

## 2019-01-20 DIAGNOSIS — R41841 Cognitive communication deficit: Secondary | ICD-10-CM | POA: Diagnosis not present

## 2019-01-20 DIAGNOSIS — M6281 Muscle weakness (generalized): Secondary | ICD-10-CM | POA: Diagnosis not present

## 2019-01-23 DIAGNOSIS — F0150 Vascular dementia without behavioral disturbance: Secondary | ICD-10-CM | POA: Diagnosis not present

## 2019-01-23 DIAGNOSIS — R2689 Other abnormalities of gait and mobility: Secondary | ICD-10-CM | POA: Diagnosis not present

## 2019-01-23 DIAGNOSIS — R41841 Cognitive communication deficit: Secondary | ICD-10-CM | POA: Diagnosis not present

## 2019-01-23 DIAGNOSIS — M6281 Muscle weakness (generalized): Secondary | ICD-10-CM | POA: Diagnosis not present

## 2019-01-24 DIAGNOSIS — R2689 Other abnormalities of gait and mobility: Secondary | ICD-10-CM | POA: Diagnosis not present

## 2019-01-24 DIAGNOSIS — M6281 Muscle weakness (generalized): Secondary | ICD-10-CM | POA: Diagnosis not present

## 2019-01-24 DIAGNOSIS — Z20828 Contact with and (suspected) exposure to other viral communicable diseases: Secondary | ICD-10-CM | POA: Diagnosis not present

## 2019-01-24 DIAGNOSIS — F0150 Vascular dementia without behavioral disturbance: Secondary | ICD-10-CM | POA: Diagnosis not present

## 2019-01-24 DIAGNOSIS — R41841 Cognitive communication deficit: Secondary | ICD-10-CM | POA: Diagnosis not present

## 2019-01-25 DIAGNOSIS — R41841 Cognitive communication deficit: Secondary | ICD-10-CM | POA: Diagnosis not present

## 2019-01-25 DIAGNOSIS — F0150 Vascular dementia without behavioral disturbance: Secondary | ICD-10-CM | POA: Diagnosis not present

## 2019-01-25 DIAGNOSIS — M6281 Muscle weakness (generalized): Secondary | ICD-10-CM | POA: Diagnosis not present

## 2019-01-25 DIAGNOSIS — R2689 Other abnormalities of gait and mobility: Secondary | ICD-10-CM | POA: Diagnosis not present

## 2019-01-26 DIAGNOSIS — F0150 Vascular dementia without behavioral disturbance: Secondary | ICD-10-CM | POA: Diagnosis not present

## 2019-01-26 DIAGNOSIS — R2689 Other abnormalities of gait and mobility: Secondary | ICD-10-CM | POA: Diagnosis not present

## 2019-01-26 DIAGNOSIS — R41841 Cognitive communication deficit: Secondary | ICD-10-CM | POA: Diagnosis not present

## 2019-01-26 DIAGNOSIS — M6281 Muscle weakness (generalized): Secondary | ICD-10-CM | POA: Diagnosis not present

## 2019-01-27 ENCOUNTER — Emergency Department (HOSPITAL_COMMUNITY): Payer: Medicare HMO

## 2019-01-27 ENCOUNTER — Encounter (HOSPITAL_COMMUNITY): Payer: Self-pay | Admitting: Emergency Medicine

## 2019-01-27 ENCOUNTER — Other Ambulatory Visit: Payer: Self-pay

## 2019-01-27 ENCOUNTER — Emergency Department (HOSPITAL_COMMUNITY)
Admission: EM | Admit: 2019-01-27 | Discharge: 2019-01-28 | Disposition: A | Discharge status: Other Acute Inpatient Hospital | Payer: Medicare HMO | Attending: Emergency Medicine | Admitting: Emergency Medicine

## 2019-01-27 DIAGNOSIS — Z23 Encounter for immunization: Secondary | ICD-10-CM | POA: Insufficient documentation

## 2019-01-27 DIAGNOSIS — Z7982 Long term (current) use of aspirin: Secondary | ICD-10-CM | POA: Diagnosis not present

## 2019-01-27 DIAGNOSIS — F17210 Nicotine dependence, cigarettes, uncomplicated: Secondary | ICD-10-CM | POA: Insufficient documentation

## 2019-01-27 DIAGNOSIS — S42401B Unspecified fracture of lower end of right humerus, initial encounter for open fracture: Secondary | ICD-10-CM | POA: Diagnosis not present

## 2019-01-27 DIAGNOSIS — S42425B Nondisplaced comminuted supracondylar fracture without intercondylar fracture of left humerus, initial encounter for open fracture: Secondary | ICD-10-CM | POA: Diagnosis not present

## 2019-01-27 DIAGNOSIS — W19XXXA Unspecified fall, initial encounter: Secondary | ICD-10-CM | POA: Insufficient documentation

## 2019-01-27 DIAGNOSIS — I10 Essential (primary) hypertension: Secondary | ICD-10-CM | POA: Insufficient documentation

## 2019-01-27 DIAGNOSIS — R05 Cough: Secondary | ICD-10-CM | POA: Diagnosis not present

## 2019-01-27 DIAGNOSIS — Z7902 Long term (current) use of antithrombotics/antiplatelets: Secondary | ICD-10-CM | POA: Diagnosis not present

## 2019-01-27 DIAGNOSIS — S0990XA Unspecified injury of head, initial encounter: Secondary | ICD-10-CM | POA: Insufficient documentation

## 2019-01-27 DIAGNOSIS — S299XXA Unspecified injury of thorax, initial encounter: Secondary | ICD-10-CM | POA: Diagnosis not present

## 2019-01-27 DIAGNOSIS — Y999 Unspecified external cause status: Secondary | ICD-10-CM | POA: Insufficient documentation

## 2019-01-27 DIAGNOSIS — Z03818 Encounter for observation for suspected exposure to other biological agents ruled out: Secondary | ICD-10-CM | POA: Diagnosis not present

## 2019-01-27 DIAGNOSIS — R0689 Other abnormalities of breathing: Secondary | ICD-10-CM | POA: Diagnosis not present

## 2019-01-27 DIAGNOSIS — Z20828 Contact with and (suspected) exposure to other viral communicable diseases: Secondary | ICD-10-CM | POA: Diagnosis not present

## 2019-01-27 DIAGNOSIS — S59912A Unspecified injury of left forearm, initial encounter: Secondary | ICD-10-CM | POA: Diagnosis not present

## 2019-01-27 DIAGNOSIS — Y939 Activity, unspecified: Secondary | ICD-10-CM | POA: Diagnosis not present

## 2019-01-27 DIAGNOSIS — I4581 Long QT syndrome: Secondary | ICD-10-CM | POA: Diagnosis not present

## 2019-01-27 DIAGNOSIS — S42402B Unspecified fracture of lower end of left humerus, initial encounter for open fracture: Secondary | ICD-10-CM

## 2019-01-27 DIAGNOSIS — S4992XA Unspecified injury of left shoulder and upper arm, initial encounter: Secondary | ICD-10-CM | POA: Diagnosis present

## 2019-01-27 DIAGNOSIS — F0390 Unspecified dementia without behavioral disturbance: Secondary | ICD-10-CM | POA: Insufficient documentation

## 2019-01-27 DIAGNOSIS — F0150 Vascular dementia without behavioral disturbance: Secondary | ICD-10-CM | POA: Diagnosis not present

## 2019-01-27 DIAGNOSIS — Y92128 Other place in nursing home as the place of occurrence of the external cause: Secondary | ICD-10-CM | POA: Diagnosis not present

## 2019-01-27 DIAGNOSIS — R58 Hemorrhage, not elsewhere classified: Secondary | ICD-10-CM | POA: Diagnosis not present

## 2019-01-27 DIAGNOSIS — M6281 Muscle weakness (generalized): Secondary | ICD-10-CM | POA: Diagnosis not present

## 2019-01-27 DIAGNOSIS — S3993XA Unspecified injury of pelvis, initial encounter: Secondary | ICD-10-CM | POA: Diagnosis not present

## 2019-01-27 DIAGNOSIS — S42435B Nondisplaced fracture (avulsion) of lateral epicondyle of left humerus, initial encounter for open fracture: Secondary | ICD-10-CM | POA: Diagnosis not present

## 2019-01-27 DIAGNOSIS — S42432A Displaced fracture (avulsion) of lateral epicondyle of left humerus, initial encounter for closed fracture: Secondary | ICD-10-CM | POA: Diagnosis not present

## 2019-01-27 DIAGNOSIS — I959 Hypotension, unspecified: Secondary | ICD-10-CM | POA: Diagnosis not present

## 2019-01-27 DIAGNOSIS — S42402A Unspecified fracture of lower end of left humerus, initial encounter for closed fracture: Secondary | ICD-10-CM | POA: Diagnosis not present

## 2019-01-27 DIAGNOSIS — R2689 Other abnormalities of gait and mobility: Secondary | ICD-10-CM | POA: Diagnosis not present

## 2019-01-27 DIAGNOSIS — R52 Pain, unspecified: Secondary | ICD-10-CM | POA: Diagnosis not present

## 2019-01-27 DIAGNOSIS — S199XXA Unspecified injury of neck, initial encounter: Secondary | ICD-10-CM | POA: Diagnosis not present

## 2019-01-27 DIAGNOSIS — R41841 Cognitive communication deficit: Secondary | ICD-10-CM | POA: Diagnosis not present

## 2019-01-27 HISTORY — DX: Essential (primary) hypertension: I10

## 2019-01-27 HISTORY — DX: Muscle weakness (generalized): M62.81

## 2019-01-27 HISTORY — DX: Depression, unspecified: F32.A

## 2019-01-27 HISTORY — DX: Hyperlipidemia, unspecified: E78.5

## 2019-01-27 LAB — BASIC METABOLIC PANEL
Anion gap: 12 (ref 5–15)
BUN: 24 mg/dL — ABNORMAL HIGH (ref 8–23)
CO2: 20 mmol/L — ABNORMAL LOW (ref 22–32)
Calcium: 8.6 mg/dL — ABNORMAL LOW (ref 8.9–10.3)
Chloride: 104 mmol/L (ref 98–111)
Creatinine, Ser: 1.44 mg/dL — ABNORMAL HIGH (ref 0.44–1.00)
GFR calc Af Amer: 41 mL/min — ABNORMAL LOW (ref >60)
GFR calc non Af Amer: 35 mL/min — ABNORMAL LOW (ref >60)
Glucose, Bld: 143 mg/dL — ABNORMAL HIGH (ref 70–99)
Potassium: 3.9 mmol/L (ref 3.5–5.1)
Sodium: 136 mmol/L (ref 135–145)

## 2019-01-27 LAB — CBC WITH DIFFERENTIAL/PLATELET
Abs Immature Granulocytes: 0.07 10*3/uL (ref 0.00–0.07)
Basophils Absolute: 0 10*3/uL (ref 0.0–0.1)
Basophils Relative: 0 %
Eosinophils Absolute: 0.2 10*3/uL (ref 0.0–0.5)
Eosinophils Relative: 1 %
HCT: 31.7 % — ABNORMAL LOW (ref 36.0–46.0)
Hemoglobin: 10.8 g/dL — ABNORMAL LOW (ref 12.0–15.0)
Immature Granulocytes: 1 %
Lymphocytes Relative: 9 %
Lymphs Abs: 1.3 10*3/uL (ref 0.7–4.0)
MCH: 30.3 pg (ref 26.0–34.0)
MCHC: 34.1 g/dL (ref 30.0–36.0)
MCV: 88.8 fL (ref 80.0–100.0)
Monocytes Absolute: 0.7 10*3/uL (ref 0.1–1.0)
Monocytes Relative: 5 %
Neutro Abs: 12.8 10*3/uL — ABNORMAL HIGH (ref 1.7–7.7)
Neutrophils Relative %: 84 %
Platelets: 243 10*3/uL (ref 150–400)
RBC: 3.57 MIL/uL — ABNORMAL LOW (ref 3.87–5.11)
RDW: 13 % (ref 11.5–15.5)
WBC: 15.1 10*3/uL — ABNORMAL HIGH (ref 4.0–10.5)
nRBC: 0 % (ref 0.0–0.2)

## 2019-01-27 LAB — PROTIME-INR
INR: 0.9 (ref 0.8–1.2)
Prothrombin Time: 12.4 seconds (ref 11.4–15.2)

## 2019-01-27 MED ORDER — CEFAZOLIN SODIUM-DEXTROSE 2-4 GM/100ML-% IV SOLN
2.0000 g | Freq: Once | INTRAVENOUS | Status: AC
  Administered 2019-01-27: 2 g via INTRAVENOUS
  Filled 2019-01-27: qty 100

## 2019-01-27 MED ORDER — ONDANSETRON HCL 4 MG/2ML IJ SOLN
4.0000 mg | Freq: Once | INTRAMUSCULAR | Status: AC
  Administered 2019-01-27: 22:00:00 4 mg via INTRAVENOUS
  Filled 2019-01-27: qty 2

## 2019-01-27 MED ORDER — TETANUS-DIPHTH-ACELL PERTUSSIS 5-2.5-18.5 LF-MCG/0.5 IM SUSP
0.5000 mL | Freq: Once | INTRAMUSCULAR | Status: AC
  Administered 2019-01-27: 0.5 mL via INTRAMUSCULAR
  Filled 2019-01-27: qty 0.5

## 2019-01-27 NOTE — ED Triage Notes (Signed)
  Pt to ED via [facility] after reported having a unwitnessed fall at [facility].  Pt has open humerus fx and also st's she hit her head when she fell.  Pt is currently taking blood thinner Plavix.  Pt had projectile vomiting on arrival to ED   Pt in C=collar at this time.   Pt was given Fentanyl 73mcg IV Pta

## 2019-01-27 NOTE — Progress Notes (Signed)
Called about type I open left distal humerus, comminuted and intra-artricular. I have reviewed case and films with EDP.  Unfortunately, this is outside of my scope of practice and would benefit from Traumatologist or elbow specialist.  Given their not being available here until Monday, would recommend transfer to academic center for more expeditious treatment and fixation.

## 2019-01-27 NOTE — ED Provider Notes (Signed)
I saw and evaluated the patient, reviewed the resident's note and I agree with the findings and plan.  EKG:   78 year old female here after an unwitnessed fall.  Has obvious deformity with open fracture to her left distal humerus.  Also has bruising on her forehead and is on blood thinners.  Awake and alert at her baseline.  Will order imaging of her head and C-spine.  Also image her left upper extremity.  Given 2 g of Ancef and will consult orthopedics pending the rest of her evaluation   Lacretia Leigh, MD 01/27/19 2220

## 2019-01-27 NOTE — ED Provider Notes (Addendum)
[facility] EMERGENCY DEPARTMENT Provider Note   CSN: [id number] Arrival date & time: 01/27/19  2155     History   Chief Complaint Chief Complaint  Patient presents with   Fall    HPI Morgan Estrada is a 78 y.o. female.     HPI  The patient is a 78 year old female with a history of depression, HLD, HTN, dementia, TIA (on ASA and Plavix), COPD (not on O2) who arrived via EMS after experiencing an unwitnessed fall at her facility, [facility]. EMS reports a possible open left humerus fracture, neurovascularly intact. Unclear head or neck trauma and unclear LOC.  She is currently taking Plavix and ASA due to a history of TIA. She was given 2mcg of Fentanyl IV prior to arrival. Of note, the patient had an episode of projectile vomiting on arrival to the ED and Zofran was administered. C-collar placed on arrival.   Past Medical History:  Diagnosis Date   Depression    Hyperlipidemia    Hypertension    Muscle weakness     Patient Active Problem List   Diagnosis Date Noted   Perforated viscus 08/10/2015   COPD (chronic obstructive pulmonary disease) ([place]) 08/10/2015   Perforated ulcer ([place]) 08/10/2015    Past Surgical History:  Procedure Laterality Date   ABDOMINAL HYSTERECTOMY     APPENDECTOMY     LAPAROTOMY N/A 08/10/2015   Procedure: EXPLORATORY LAPAROTOMY;  Surgeon: Armandina Gemma, MD;  Location: [facility];  Service: General;  Laterality: N/A;     OB History   No obstetric history on file.      Home Medications    Prior to Admission medications   Medication Sig Start Date End Date Taking? Authorizing Provider  amoxicillin (AMOXIL) 500 MG capsule Take 2 capsules (1,000 mg total) by mouth every 12 (twelve) hours. 2/77/82   Leighton Ruff, MD  aspirin 423 MG tablet Take 325 mg by mouth daily.    [provider]  clarithromycin (BIAXIN) 500 MG tablet Take 1 tablet (500 mg total) by mouth every 12 (twelve) hours. 5/36/14    Leighton Ruff, MD  HYDROcodone-acetaminophen (NORCO/VICODIN) 5-325 MG per tablet 1 tab PO q12 hours prn pain Patient not taking: Reported on 08/10/2015 12/16/14   Francine Graven, DO  ondansetron (ZOFRAN) 4 MG tablet Take 1 tablet (4 mg total) by mouth every 8 (eight) hours as needed for nausea or vomiting. Patient not taking: Reported on 08/10/2015 12/16/14   Francine Graven, DO  pantoprazole (PROTONIX) 40 MG tablet Take 1 tablet (40 mg total) by mouth 2 (two) times daily. 4/31/54   Leighton Ruff, MD    Family History No family history on file.  Social History Social History   Tobacco Use   Smoking status: Current Every Day Smoker    Packs/day: 1.00    Types: Cigarettes   Smokeless tobacco: Never Used  Substance Use Topics   Alcohol use: No   Drug use: No     Allergies   Patient has no known allergies.   Review of Systems Review of Systems  Unable to perform ROS: Dementia     Physical Exam Updated Vital Signs BP (!) 169/66 (BP Location: Right Arm)    Pulse 88    Temp 97.7 F (36.5 C) (Temporal)    Resp 20    Ht 5\' 4"  (1.626 m)    Wt 70.3 kg    SpO2 95%    BMI 26.61 kg/m   Physical Exam  Vitals signs and nursing note reviewed.  Constitutional:      General: She is not in acute distress.    Appearance: She is well-developed.  HENT:     Head: Normocephalic and atraumatic.  Eyes:     Conjunctiva/sclera: Conjunctivae normal.  Neck:     Musculoskeletal: Neck supple.     Comments: No midline cervical TTP Cardiovascular:     Rate and Rhythm: Normal rate and regular rhythm.  Pulmonary:     Effort: Pulmonary effort is normal. No respiratory distress.     Breath sounds: Normal breath sounds.  Abdominal:     Palpations: Abdomen is soft.     Tenderness: There is no abdominal tenderness.  Musculoskeletal:        General: Swelling, tenderness, deformity and signs of injury present.     Comments: No midline lumbar or thoracic TTP.   Left arm with gross  deformity, NVI, wiggling fingers, intact sensation distally, intact 2+ radial pulse. 1cm punctate lesion noted superior to the lateral elbow, hemostatic.  Skin:    General: Skin is warm and dry.  Neurological:     Mental Status: She is alert.      ED Treatments / Results  Labs (all labs ordered are listed, but only abnormal results are displayed) Labs Reviewed  CBC WITH DIFFERENTIAL/PLATELET - Abnormal; Notable for the following components:      Result Value   WBC 15.1 (*)    RBC 3.57 (*)    Hemoglobin 10.8 (*)    HCT 31.7 (*)    Neutro Abs 12.8 (*)    All other components within normal limits  BASIC METABOLIC PANEL - Abnormal; Notable for the following components:   CO2 20 (*)    Glucose, Bld 143 (*)    BUN 24 (*)    Creatinine, Ser 1.44 (*)    Calcium 8.6 (*)    GFR calc non Af Amer 35 (*)    GFR calc Af Amer 41 (*)    All other components within normal limits  PROTIME-INR    EKG None  Radiology Dg Forearm Left  Result Date: 01/27/2019 CLINICAL DATA:  Fall EXAM: LEFT FOREARM - 2 VIEW COMPARISON:  None. FINDINGS: There is no evidence of fracture or other focal bone lesions of the forearm. Partially visualized comminuted distal humerus fracture there is diffuse osteopenia. Soft tissues are unremarkable. IMPRESSION: No acute osseous injury of the forearm. Electronically Signed   By: Jonna Clark M.D.   On: 01/27/2019 22:57   Ct Head Wo Contrast  Result Date: 01/27/2019 CLINICAL DATA:  Unwitnessed fall EXAM: CT HEAD WITHOUT CONTRAST; CT CERVICAL SPINE WITHOUT CONTRAST TECHNIQUE: Contiguous axial images were obtained from the base of the skull through the vertex without intravenous contrast. COMPARISON:  July 12, 2018 FINDINGS: Brain: No evidence of acute territorial infarction, hemorrhage, hydrocephalus,extra-axial collection or mass lesion/mass effect. Again noted are bilateral basal ganglia and thalami lacunar infarcts. There is dilatation the ventricles and sulci  consistent with age-related atrophy. Low-attenuation changes in the deep white matter consistent with small vessel ischemia. Vascular: No hyperdense vessel or unexpected calcification. Skull: The skull is intact. No fracture or focal lesion identified. Sinuses/Orbits: The visualized paranasal sinuses and mastoid air cells are clear. The orbits and globes intact. Other: None Cervical spine: Alignment: There is a minimal anterolisthesis of C4 on C5. Skull base and vertebrae: Visualized skull base is intact. No atlanto-occipital dissociation. The vertebral body heights are well maintained. No fracture or pathologic osseous lesion  seen. Soft tissues and spinal canal: The visualized paraspinal soft tissues are unremarkable. No prevertebral soft tissue swelling is seen. The spinal canal is grossly unremarkable, no large epidural collection or significant canal narrowing. Disc levels: Multilevel degenerative changes are seen throughout the cervical spine. This is most notable at C5-C6 with disc osteophyte complex with uncovertebral osteophytes. Upper chest: The lung apices are clear. Thoracic inlet is within normal limits. Other: None IMPRESSION: No acute intracranial abnormality. Chronic bilateral basal ganglia and thalamus lacunar infarct Findings consistent with age related atrophy and chronic small vessel ischemia No acute fracture or malalignment of the spine. Electronically Signed   By: Bindu  Avutu M.D.   On: 10/02/202Jonna Clark0 23:04   Ct Cervical Spine Wo Contrast  Result Date: 01/27/2019 CLINICAL DATA:  Unwitnessed fall EXAM: CT HEAD WITHOUT CONTRAST; CT CERVICAL SPINE WITHOUT CONTRAST TECHNIQUE: Contiguous axial images were obtained from the base of the skull through the vertex without intravenous contrast. COMPARISON:  July 12, 2018 FINDINGS: Brain: No evidence of acute territorial infarction, hemorrhage, hydrocephalus,extra-axial collection or mass lesion/mass effect. Again noted are bilateral basal ganglia and  thalami lacunar infarcts. There is dilatation the ventricles and sulci consistent with age-related atrophy. Low-attenuation changes in the deep white matter consistent with small vessel ischemia. Vascular: No hyperdense vessel or unexpected calcification. Skull: The skull is intact. No fracture or focal lesion identified. Sinuses/Orbits: The visualized paranasal sinuses and mastoid air cells are clear. The orbits and globes intact. Other: None Cervical spine: Alignment: There is a minimal anterolisthesis of C4 on C5. Skull base and vertebrae: Visualized skull base is intact. No atlanto-occipital dissociation. The vertebral body heights are well maintained. No fracture or pathologic osseous lesion seen. Soft tissues and spinal canal: The visualized paraspinal soft tissues are unremarkable. No prevertebral soft tissue swelling is seen. The spinal canal is grossly unremarkable, no large epidural collection or significant canal narrowing. Disc levels: Multilevel degenerative changes are seen throughout the cervical spine. This is most notable at C5-C6 with disc osteophyte complex with uncovertebral osteophytes. Upper chest: The lung apices are clear. Thoracic inlet is within normal limits. Other: None IMPRESSION: No acute intracranial abnormality. Chronic bilateral basal ganglia and thalamus lacunar infarct Findings consistent with age related atrophy and chronic small vessel ischemia No acute fracture or malalignment of the spine. Electronically Signed   By: Jonna ClarkBindu  Avutu M.D.   On: 01/27/2019 23:04   Dg Pelvis Portable  Result Date: 01/27/2019 CLINICAL DATA:  Fall EXAM: PORTABLE PELVIS 1-2 VIEWS COMPARISON:  None. FINDINGS: There is diffuse osteopenia which somewhat limits evaluation. No definite fracture or dislocation. Bilateral hip osteoarthritis is seen. IMPRESSION: Diffuse osteopenia which limits evaluation.  No definite fracture. Electronically Signed   By: Jonna ClarkBindu  Avutu M.D.   On: 01/27/2019 23:50   Dg  Chest Portable 1 View  Result Date: 01/27/2019 CLINICAL DATA:  Fall EXAM: PORTABLE CHEST 1 VIEW COMPARISON:  August 11, 2015 FINDINGS: The heart size and mediastinal contours are within normal limits. Both lungs are clear. The visualized skeletal structures are unremarkable. IMPRESSION: No acute cardiopulmonary process. Electronically Signed   By: Jonna ClarkBindu  Avutu M.D.   On: 01/27/2019 22:56   Dg Humerus Left  Result Date: 01/27/2019 CLINICAL DATA:  Pain after fall EXAM: LEFT HUMERUS - 2+ VIEW COMPARISON:  None. FINDINGS: There is comminuted impacted intra-articular distal supracondylar and lateral condylar fracture. There is a small fracture fragment seen within the anterolateral soft tissues. Diffuse osteopenia. IMPRESSION: Comminuted intra-articular distal humeral and lateral epicondyle fracture. Electronically Signed  By: Jonna Clark M.D.   On: 01/27/2019 22:55    Procedures Procedures (including critical care time)  Medications Ordered in ED Medications  HYDROmorphone (DILAUDID) injection 0.5 mg (0 mg Intravenous Hold 01/28/19 0036)  ondansetron (ZOFRAN) injection 4 mg (4 mg Intravenous Given 01/27/19 2200)  ceFAZolin (ANCEF) IVPB 2g/100 mL premix (0 g Intravenous Stopped 01/28/19 0023)  Tdap (BOOSTRIX) injection 0.5 mL (0.5 mLs Intramuscular Given 01/27/19 2227)     Initial Impression / Assessment and Plan / ED Course  I have reviewed the triage vital signs and the nursing notes.  Pertinent labs & imaging results that were available during my care of the patient were reviewed by me and considered in my medical decision making (see chart for details).  Clinical Course as of Jan 27 46  Fri Jan 27, 2019  2306 WBC(!): 15.1 [JL]    Clinical Course User Index [JL] Ernie Avena, MD       The patient presents following an unwitnessed fall at her nursing facility with findings concerning for an open humerus fracture. She is currently neurovascularly intact. 2g IV Ancef was administered  on arrival in addition to Tdap. Imaging performed to include a CT Head and C-spine which were negative. CXR and Pelvis XR were normal. XR of the forearm was without acute fracture. XR of the humerus was concerning for a comminuted intra-articular distal humerus and lateral epicondyle fracture. No other injuries were identified on imaging and secondary exam. Orthopedics was consulted for further management. Per orthopedics, "type I open left distal humerus, comminuted and intra-artricular. I have reviewed case and films with EDP. Unfortunately, this is outside of my scope of practice and would benefit from Traumatologist or elbow specialist.  Given their not being available here until Monday, would recommend transfer to academic center for more expeditious treatment and fixation."  [facility] contacted for transfer and automatically accepted the patient in transfer. The extremity was dressed and a splint was placed. Care of the patient was signed out to Dr. Blinda Leatherwood at [phone].  Final Clinical Impressions(s) / ED Diagnoses   Final diagnoses:  Open fracture of distal end of left humerus, unspecified fracture morphology, initial encounter    ED Discharge Orders    None       Ernie Avena, MD 01/28/19 7681    Ernie Avena, MD 01/28/19 0050    Lorre Nick, MD 01/30/19 1702

## 2019-01-27 NOTE — ED Notes (Signed)
Please call pt's daughter Herbert Seta at [phone] to give her an update on her mom.

## 2019-01-28 DIAGNOSIS — M47816 Spondylosis without myelopathy or radiculopathy, lumbar region: Secondary | ICD-10-CM | POA: Diagnosis not present

## 2019-01-28 DIAGNOSIS — S42435B Nondisplaced fracture (avulsion) of lateral epicondyle of left humerus, initial encounter for open fracture: Secondary | ICD-10-CM | POA: Diagnosis not present

## 2019-01-28 DIAGNOSIS — W19XXXA Unspecified fall, initial encounter: Secondary | ICD-10-CM | POA: Diagnosis not present

## 2019-01-28 DIAGNOSIS — I4581 Long QT syndrome: Secondary | ICD-10-CM | POA: Diagnosis not present

## 2019-01-28 DIAGNOSIS — Z20828 Contact with and (suspected) exposure to other viral communicable diseases: Secondary | ICD-10-CM | POA: Diagnosis not present

## 2019-01-28 DIAGNOSIS — F0390 Unspecified dementia without behavioral disturbance: Secondary | ICD-10-CM | POA: Diagnosis not present

## 2019-01-28 DIAGNOSIS — E785 Hyperlipidemia, unspecified: Secondary | ICD-10-CM | POA: Diagnosis not present

## 2019-01-28 DIAGNOSIS — Z7401 Bed confinement status: Secondary | ICD-10-CM | POA: Diagnosis not present

## 2019-01-28 DIAGNOSIS — S42425B Nondisplaced comminuted supracondylar fracture without intercondylar fracture of left humerus, initial encounter for open fracture: Secondary | ICD-10-CM | POA: Diagnosis not present

## 2019-01-28 DIAGNOSIS — Z7902 Long term (current) use of antithrombotics/antiplatelets: Secondary | ICD-10-CM | POA: Diagnosis not present

## 2019-01-28 DIAGNOSIS — S3993XA Unspecified injury of pelvis, initial encounter: Secondary | ICD-10-CM | POA: Diagnosis not present

## 2019-01-28 DIAGNOSIS — Y92129 Unspecified place in nursing home as the place of occurrence of the external cause: Secondary | ICD-10-CM | POA: Diagnosis not present

## 2019-01-28 DIAGNOSIS — Z66 Do not resuscitate: Secondary | ICD-10-CM | POA: Diagnosis not present

## 2019-01-28 DIAGNOSIS — S299XXA Unspecified injury of thorax, initial encounter: Secondary | ICD-10-CM | POA: Diagnosis not present

## 2019-01-28 DIAGNOSIS — M810 Age-related osteoporosis without current pathological fracture: Secondary | ICD-10-CM | POA: Diagnosis not present

## 2019-01-28 DIAGNOSIS — F17210 Nicotine dependence, cigarettes, uncomplicated: Secondary | ICD-10-CM | POA: Diagnosis not present

## 2019-01-28 DIAGNOSIS — M4856XA Collapsed vertebra, not elsewhere classified, lumbar region, initial encounter for fracture: Secondary | ICD-10-CM | POA: Diagnosis not present

## 2019-01-28 DIAGNOSIS — S32030A Wedge compression fracture of third lumbar vertebra, initial encounter for closed fracture: Secondary | ICD-10-CM | POA: Diagnosis not present

## 2019-01-28 DIAGNOSIS — S3991XA Unspecified injury of abdomen, initial encounter: Secondary | ICD-10-CM | POA: Diagnosis not present

## 2019-01-28 DIAGNOSIS — I10 Essential (primary) hypertension: Secondary | ICD-10-CM | POA: Diagnosis not present

## 2019-01-28 DIAGNOSIS — Z7901 Long term (current) use of anticoagulants: Secondary | ICD-10-CM | POA: Diagnosis not present

## 2019-01-28 DIAGNOSIS — Y999 Unspecified external cause status: Secondary | ICD-10-CM | POA: Diagnosis not present

## 2019-01-28 DIAGNOSIS — Y92128 Other place in nursing home as the place of occurrence of the external cause: Secondary | ICD-10-CM | POA: Diagnosis not present

## 2019-01-28 DIAGNOSIS — S42412B Displaced simple supracondylar fracture without intercondylar fracture of left humerus, initial encounter for open fracture: Secondary | ICD-10-CM | POA: Diagnosis not present

## 2019-01-28 DIAGNOSIS — S3992XA Unspecified injury of lower back, initial encounter: Secondary | ICD-10-CM | POA: Diagnosis not present

## 2019-01-28 DIAGNOSIS — R296 Repeated falls: Secondary | ICD-10-CM | POA: Diagnosis not present

## 2019-01-28 DIAGNOSIS — S32039A Unspecified fracture of third lumbar vertebra, initial encounter for closed fracture: Secondary | ICD-10-CM | POA: Diagnosis not present

## 2019-01-28 DIAGNOSIS — Z23 Encounter for immunization: Secondary | ICD-10-CM | POA: Diagnosis not present

## 2019-01-28 DIAGNOSIS — S4992XA Unspecified injury of left shoulder and upper arm, initial encounter: Secondary | ICD-10-CM | POA: Diagnosis present

## 2019-01-28 DIAGNOSIS — S42422A Displaced comminuted supracondylar fracture without intercondylar fracture of left humerus, initial encounter for closed fracture: Secondary | ICD-10-CM | POA: Diagnosis not present

## 2019-01-28 DIAGNOSIS — S59912A Unspecified injury of left forearm, initial encounter: Secondary | ICD-10-CM | POA: Diagnosis not present

## 2019-01-28 DIAGNOSIS — R269 Unspecified abnormalities of gait and mobility: Secondary | ICD-10-CM | POA: Diagnosis not present

## 2019-01-28 DIAGNOSIS — S0990XA Unspecified injury of head, initial encounter: Secondary | ICD-10-CM | POA: Diagnosis not present

## 2019-01-28 DIAGNOSIS — I513 Intracardiac thrombosis, not elsewhere classified: Secondary | ICD-10-CM | POA: Diagnosis not present

## 2019-01-28 DIAGNOSIS — S42492A Other displaced fracture of lower end of left humerus, initial encounter for closed fracture: Secondary | ICD-10-CM | POA: Diagnosis not present

## 2019-01-28 DIAGNOSIS — J449 Chronic obstructive pulmonary disease, unspecified: Secondary | ICD-10-CM | POA: Diagnosis not present

## 2019-01-28 DIAGNOSIS — S42412A Displaced simple supracondylar fracture without intercondylar fracture of left humerus, initial encounter for closed fracture: Secondary | ICD-10-CM | POA: Diagnosis not present

## 2019-01-28 DIAGNOSIS — S72402A Unspecified fracture of lower end of left femur, initial encounter for closed fracture: Secondary | ICD-10-CM | POA: Diagnosis not present

## 2019-01-28 DIAGNOSIS — R489 Unspecified symbolic dysfunctions: Secondary | ICD-10-CM | POA: Diagnosis not present

## 2019-01-28 DIAGNOSIS — Z7982 Long term (current) use of aspirin: Secondary | ICD-10-CM | POA: Diagnosis not present

## 2019-01-28 DIAGNOSIS — F05 Delirium due to known physiological condition: Secondary | ICD-10-CM | POA: Diagnosis not present

## 2019-01-28 DIAGNOSIS — F0280 Dementia in other diseases classified elsewhere without behavioral disturbance: Secondary | ICD-10-CM | POA: Diagnosis not present

## 2019-01-28 DIAGNOSIS — S42492B Other displaced fracture of lower end of left humerus, initial encounter for open fracture: Secondary | ICD-10-CM | POA: Diagnosis not present

## 2019-01-28 DIAGNOSIS — I714 Abdominal aortic aneurysm, without rupture: Secondary | ICD-10-CM | POA: Diagnosis not present

## 2019-01-28 DIAGNOSIS — M6281 Muscle weakness (generalized): Secondary | ICD-10-CM | POA: Diagnosis not present

## 2019-01-28 DIAGNOSIS — Y939 Activity, unspecified: Secondary | ICD-10-CM | POA: Diagnosis not present

## 2019-01-28 DIAGNOSIS — S199XXA Unspecified injury of neck, initial encounter: Secondary | ICD-10-CM | POA: Diagnosis not present

## 2019-01-28 DIAGNOSIS — Z9181 History of falling: Secondary | ICD-10-CM | POA: Diagnosis not present

## 2019-01-28 DIAGNOSIS — S42402A Unspecified fracture of lower end of left humerus, initial encounter for closed fracture: Secondary | ICD-10-CM | POA: Diagnosis not present

## 2019-01-28 LAB — SARS CORONAVIRUS 2 BY RT PCR (HOSPITAL ORDER, PERFORMED IN ~~LOC~~ HOSPITAL LAB): SARS Coronavirus 2: NEGATIVE

## 2019-01-28 MED ORDER — HYDROMORPHONE HCL 1 MG/ML IJ SOLN: 0.5000 mg | Freq: Once | INTRAMUSCULAR | Status: DC

## 2019-01-28 NOTE — Progress Notes (Signed)
Orthopedic Tech Progress Note Patient Details:  Morgan Estrada 21-Jan-1941 [id number]  Ortho Devices Type of Ortho Device: Post (long arm) splint Ortho Device/Splint Location: lue. applied at drs request pre transport to [facility]. Ortho Device/Splint Interventions: Ordered, Application, Adjustment   Post Interventions Patient Tolerated: Well Instructions Provided: Care of device, Adjustment of device   Karolee Stamps 01/28/2019, 1:19 AM

## 2019-01-28 NOTE — ED Notes (Signed)
Splint applied by ortho tech

## 2019-01-28 NOTE — ED Notes (Signed)
Called [facility] for transport to [facility]

## 2019-01-28 NOTE — ED Notes (Signed)
Dressing applied to left arm.  Radial pulse present.  Sensation intact.

## 2019-01-29 DIAGNOSIS — S42412A Displaced simple supracondylar fracture without intercondylar fracture of left humerus, initial encounter for closed fracture: Secondary | ICD-10-CM | POA: Diagnosis not present

## 2019-01-29 DIAGNOSIS — W19XXXA Unspecified fall, initial encounter: Secondary | ICD-10-CM | POA: Diagnosis not present

## 2019-01-29 DIAGNOSIS — S42422A Displaced comminuted supracondylar fracture without intercondylar fracture of left humerus, initial encounter for closed fracture: Secondary | ICD-10-CM | POA: Diagnosis not present

## 2019-01-29 DIAGNOSIS — F0280 Dementia in other diseases classified elsewhere without behavioral disturbance: Secondary | ICD-10-CM | POA: Diagnosis not present

## 2019-01-30 DIAGNOSIS — S42412A Displaced simple supracondylar fracture without intercondylar fracture of left humerus, initial encounter for closed fracture: Secondary | ICD-10-CM | POA: Diagnosis not present

## 2019-01-30 DIAGNOSIS — F0280 Dementia in other diseases classified elsewhere without behavioral disturbance: Secondary | ICD-10-CM | POA: Diagnosis not present

## 2019-01-30 DIAGNOSIS — W19XXXA Unspecified fall, initial encounter: Secondary | ICD-10-CM | POA: Diagnosis not present

## 2019-01-30 DIAGNOSIS — S42422A Displaced comminuted supracondylar fracture without intercondylar fracture of left humerus, initial encounter for closed fracture: Secondary | ICD-10-CM | POA: Diagnosis not present

## 2019-01-31 DIAGNOSIS — S42422A Displaced comminuted supracondylar fracture without intercondylar fracture of left humerus, initial encounter for closed fracture: Secondary | ICD-10-CM | POA: Diagnosis not present

## 2019-01-31 DIAGNOSIS — S42412A Displaced simple supracondylar fracture without intercondylar fracture of left humerus, initial encounter for closed fracture: Secondary | ICD-10-CM | POA: Diagnosis not present

## 2019-01-31 DIAGNOSIS — F05 Delirium due to known physiological condition: Secondary | ICD-10-CM | POA: Diagnosis not present

## 2019-01-31 DIAGNOSIS — M47816 Spondylosis without myelopathy or radiculopathy, lumbar region: Secondary | ICD-10-CM | POA: Diagnosis not present

## 2019-01-31 DIAGNOSIS — W19XXXA Unspecified fall, initial encounter: Secondary | ICD-10-CM | POA: Diagnosis not present

## 2019-01-31 DIAGNOSIS — S3992XA Unspecified injury of lower back, initial encounter: Secondary | ICD-10-CM | POA: Diagnosis not present

## 2019-01-31 DIAGNOSIS — F0280 Dementia in other diseases classified elsewhere without behavioral disturbance: Secondary | ICD-10-CM | POA: Diagnosis not present

## 2019-01-31 DIAGNOSIS — M4856XA Collapsed vertebra, not elsewhere classified, lumbar region, initial encounter for fracture: Secondary | ICD-10-CM | POA: Diagnosis not present

## 2019-01-31 DIAGNOSIS — S42492A Other displaced fracture of lower end of left humerus, initial encounter for closed fracture: Secondary | ICD-10-CM | POA: Diagnosis not present

## 2019-02-01 DIAGNOSIS — S42422A Displaced comminuted supracondylar fracture without intercondylar fracture of left humerus, initial encounter for closed fracture: Secondary | ICD-10-CM | POA: Diagnosis not present

## 2019-02-01 DIAGNOSIS — F0280 Dementia in other diseases classified elsewhere without behavioral disturbance: Secondary | ICD-10-CM | POA: Diagnosis not present

## 2019-02-01 DIAGNOSIS — S42412A Displaced simple supracondylar fracture without intercondylar fracture of left humerus, initial encounter for closed fracture: Secondary | ICD-10-CM | POA: Diagnosis not present

## 2019-02-01 DIAGNOSIS — W19XXXA Unspecified fall, initial encounter: Secondary | ICD-10-CM | POA: Diagnosis not present

## 2019-02-02 DIAGNOSIS — W19XXXA Unspecified fall, initial encounter: Secondary | ICD-10-CM | POA: Diagnosis not present

## 2019-02-02 DIAGNOSIS — F05 Delirium due to known physiological condition: Secondary | ICD-10-CM | POA: Diagnosis not present

## 2019-02-02 DIAGNOSIS — S42412A Displaced simple supracondylar fracture without intercondylar fracture of left humerus, initial encounter for closed fracture: Secondary | ICD-10-CM | POA: Diagnosis not present

## 2019-02-02 DIAGNOSIS — S42422A Displaced comminuted supracondylar fracture without intercondylar fracture of left humerus, initial encounter for closed fracture: Secondary | ICD-10-CM | POA: Diagnosis not present

## 2019-02-02 DIAGNOSIS — F0280 Dementia in other diseases classified elsewhere without behavioral disturbance: Secondary | ICD-10-CM | POA: Diagnosis not present

## 2019-02-03 DIAGNOSIS — S42412A Displaced simple supracondylar fracture without intercondylar fracture of left humerus, initial encounter for closed fracture: Secondary | ICD-10-CM | POA: Diagnosis not present

## 2019-02-03 DIAGNOSIS — R269 Unspecified abnormalities of gait and mobility: Secondary | ICD-10-CM | POA: Diagnosis not present

## 2019-02-03 DIAGNOSIS — S32030A Wedge compression fracture of third lumbar vertebra, initial encounter for closed fracture: Secondary | ICD-10-CM | POA: Diagnosis not present

## 2019-02-03 DIAGNOSIS — Z7401 Bed confinement status: Secondary | ICD-10-CM | POA: Diagnosis not present

## 2019-02-03 DIAGNOSIS — Z9181 History of falling: Secondary | ICD-10-CM | POA: Diagnosis not present

## 2019-02-03 DIAGNOSIS — M6281 Muscle weakness (generalized): Secondary | ICD-10-CM | POA: Diagnosis not present

## 2019-02-03 DIAGNOSIS — W19XXXA Unspecified fall, initial encounter: Secondary | ICD-10-CM | POA: Diagnosis not present

## 2019-02-04 DIAGNOSIS — I10 Essential (primary) hypertension: Secondary | ICD-10-CM | POA: Diagnosis not present

## 2019-02-04 DIAGNOSIS — S42292A Other displaced fracture of upper end of left humerus, initial encounter for closed fracture: Secondary | ICD-10-CM | POA: Diagnosis not present

## 2019-02-04 DIAGNOSIS — E782 Mixed hyperlipidemia: Secondary | ICD-10-CM | POA: Diagnosis not present

## 2019-02-04 DIAGNOSIS — I6389 Other cerebral infarction: Secondary | ICD-10-CM | POA: Diagnosis not present

## 2019-02-07 DIAGNOSIS — M6281 Muscle weakness (generalized): Secondary | ICD-10-CM | POA: Diagnosis not present

## 2019-02-07 DIAGNOSIS — R2689 Other abnormalities of gait and mobility: Secondary | ICD-10-CM | POA: Diagnosis not present

## 2019-02-07 DIAGNOSIS — S42215G Unspecified nondisplaced fracture of surgical neck of left humerus, subsequent encounter for fracture with delayed healing: Secondary | ICD-10-CM | POA: Diagnosis not present

## 2019-02-07 DIAGNOSIS — Z20828 Contact with and (suspected) exposure to other viral communicable diseases: Secondary | ICD-10-CM | POA: Diagnosis not present

## 2019-02-09 DIAGNOSIS — M6281 Muscle weakness (generalized): Secondary | ICD-10-CM | POA: Diagnosis not present

## 2019-02-09 DIAGNOSIS — S42215G Unspecified nondisplaced fracture of surgical neck of left humerus, subsequent encounter for fracture with delayed healing: Secondary | ICD-10-CM | POA: Diagnosis not present

## 2019-02-09 DIAGNOSIS — R2689 Other abnormalities of gait and mobility: Secondary | ICD-10-CM | POA: Diagnosis not present

## 2019-02-10 DIAGNOSIS — R2689 Other abnormalities of gait and mobility: Secondary | ICD-10-CM | POA: Diagnosis not present

## 2019-02-10 DIAGNOSIS — M6281 Muscle weakness (generalized): Secondary | ICD-10-CM | POA: Diagnosis not present

## 2019-02-10 DIAGNOSIS — S42215G Unspecified nondisplaced fracture of surgical neck of left humerus, subsequent encounter for fracture with delayed healing: Secondary | ICD-10-CM | POA: Diagnosis not present

## 2019-02-11 DIAGNOSIS — S42215G Unspecified nondisplaced fracture of surgical neck of left humerus, subsequent encounter for fracture with delayed healing: Secondary | ICD-10-CM | POA: Diagnosis not present

## 2019-02-11 DIAGNOSIS — R2689 Other abnormalities of gait and mobility: Secondary | ICD-10-CM | POA: Diagnosis not present

## 2019-02-11 DIAGNOSIS — M6281 Muscle weakness (generalized): Secondary | ICD-10-CM | POA: Diagnosis not present

## 2019-02-13 DIAGNOSIS — M6281 Muscle weakness (generalized): Secondary | ICD-10-CM | POA: Diagnosis not present

## 2019-02-13 DIAGNOSIS — R2689 Other abnormalities of gait and mobility: Secondary | ICD-10-CM | POA: Diagnosis not present

## 2019-02-13 DIAGNOSIS — S42215G Unspecified nondisplaced fracture of surgical neck of left humerus, subsequent encounter for fracture with delayed healing: Secondary | ICD-10-CM | POA: Diagnosis not present

## 2019-02-14 DIAGNOSIS — S42412B Displaced simple supracondylar fracture without intercondylar fracture of left humerus, initial encounter for open fracture: Secondary | ICD-10-CM | POA: Diagnosis not present

## 2019-02-14 DIAGNOSIS — W1839XD Other fall on same level, subsequent encounter: Secondary | ICD-10-CM | POA: Diagnosis not present

## 2019-02-15 DIAGNOSIS — M6281 Muscle weakness (generalized): Secondary | ICD-10-CM | POA: Diagnosis not present

## 2019-02-15 DIAGNOSIS — S42215G Unspecified nondisplaced fracture of surgical neck of left humerus, subsequent encounter for fracture with delayed healing: Secondary | ICD-10-CM | POA: Diagnosis not present

## 2019-02-15 DIAGNOSIS — R2689 Other abnormalities of gait and mobility: Secondary | ICD-10-CM | POA: Diagnosis not present

## 2019-02-15 DIAGNOSIS — Z20828 Contact with and (suspected) exposure to other viral communicable diseases: Secondary | ICD-10-CM | POA: Diagnosis not present

## 2019-02-16 DIAGNOSIS — R2689 Other abnormalities of gait and mobility: Secondary | ICD-10-CM | POA: Diagnosis not present

## 2019-02-16 DIAGNOSIS — M6281 Muscle weakness (generalized): Secondary | ICD-10-CM | POA: Diagnosis not present

## 2019-02-16 DIAGNOSIS — S42215G Unspecified nondisplaced fracture of surgical neck of left humerus, subsequent encounter for fracture with delayed healing: Secondary | ICD-10-CM | POA: Diagnosis not present

## 2019-02-17 DIAGNOSIS — S42215G Unspecified nondisplaced fracture of surgical neck of left humerus, subsequent encounter for fracture with delayed healing: Secondary | ICD-10-CM | POA: Diagnosis not present

## 2019-02-17 DIAGNOSIS — R2689 Other abnormalities of gait and mobility: Secondary | ICD-10-CM | POA: Diagnosis not present

## 2019-02-17 DIAGNOSIS — M6281 Muscle weakness (generalized): Secondary | ICD-10-CM | POA: Diagnosis not present

## 2019-02-17 DIAGNOSIS — W1839XA Other fall on same level, initial encounter: Secondary | ICD-10-CM | POA: Diagnosis not present

## 2019-02-17 DIAGNOSIS — S42412B Displaced simple supracondylar fracture without intercondylar fracture of left humerus, initial encounter for open fracture: Secondary | ICD-10-CM | POA: Diagnosis not present

## 2019-02-18 DIAGNOSIS — R2689 Other abnormalities of gait and mobility: Secondary | ICD-10-CM | POA: Diagnosis not present

## 2019-02-18 DIAGNOSIS — M6281 Muscle weakness (generalized): Secondary | ICD-10-CM | POA: Diagnosis not present

## 2019-02-18 DIAGNOSIS — S42215G Unspecified nondisplaced fracture of surgical neck of left humerus, subsequent encounter for fracture with delayed healing: Secondary | ICD-10-CM | POA: Diagnosis not present

## 2019-02-20 DIAGNOSIS — S42215G Unspecified nondisplaced fracture of surgical neck of left humerus, subsequent encounter for fracture with delayed healing: Secondary | ICD-10-CM | POA: Diagnosis not present

## 2019-02-20 DIAGNOSIS — R2689 Other abnormalities of gait and mobility: Secondary | ICD-10-CM | POA: Diagnosis not present

## 2019-02-20 DIAGNOSIS — M6281 Muscle weakness (generalized): Secondary | ICD-10-CM | POA: Diagnosis not present

## 2019-02-20 DIAGNOSIS — Z20828 Contact with and (suspected) exposure to other viral communicable diseases: Secondary | ICD-10-CM | POA: Diagnosis not present

## 2019-02-21 DIAGNOSIS — M6281 Muscle weakness (generalized): Secondary | ICD-10-CM | POA: Diagnosis not present

## 2019-02-21 DIAGNOSIS — S42215G Unspecified nondisplaced fracture of surgical neck of left humerus, subsequent encounter for fracture with delayed healing: Secondary | ICD-10-CM | POA: Diagnosis not present

## 2019-02-21 DIAGNOSIS — R2689 Other abnormalities of gait and mobility: Secondary | ICD-10-CM | POA: Diagnosis not present

## 2019-02-22 DIAGNOSIS — M6281 Muscle weakness (generalized): Secondary | ICD-10-CM | POA: Diagnosis not present

## 2019-02-22 DIAGNOSIS — S42215G Unspecified nondisplaced fracture of surgical neck of left humerus, subsequent encounter for fracture with delayed healing: Secondary | ICD-10-CM | POA: Diagnosis not present

## 2019-02-22 DIAGNOSIS — R2689 Other abnormalities of gait and mobility: Secondary | ICD-10-CM | POA: Diagnosis not present

## 2019-02-23 DIAGNOSIS — M79632 Pain in left forearm: Secondary | ICD-10-CM | POA: Diagnosis not present

## 2019-02-23 DIAGNOSIS — S32030A Wedge compression fracture of third lumbar vertebra, initial encounter for closed fracture: Secondary | ICD-10-CM | POA: Diagnosis not present

## 2019-02-23 DIAGNOSIS — F0390 Unspecified dementia without behavioral disturbance: Secondary | ICD-10-CM | POA: Diagnosis not present

## 2019-02-23 DIAGNOSIS — S32030D Wedge compression fracture of third lumbar vertebra, subsequent encounter for fracture with routine healing: Secondary | ICD-10-CM | POA: Diagnosis not present

## 2019-02-23 DIAGNOSIS — Z7401 Bed confinement status: Secondary | ICD-10-CM | POA: Diagnosis not present

## 2019-02-23 DIAGNOSIS — S42412B Displaced simple supracondylar fracture without intercondylar fracture of left humerus, initial encounter for open fracture: Secondary | ICD-10-CM | POA: Diagnosis not present

## 2019-02-23 DIAGNOSIS — Z7982 Long term (current) use of aspirin: Secondary | ICD-10-CM | POA: Diagnosis not present

## 2019-02-23 DIAGNOSIS — K219 Gastro-esophageal reflux disease without esophagitis: Secondary | ICD-10-CM | POA: Diagnosis not present

## 2019-02-23 DIAGNOSIS — R410 Disorientation, unspecified: Secondary | ICD-10-CM | POA: Diagnosis not present

## 2019-02-23 DIAGNOSIS — W1839XA Other fall on same level, initial encounter: Secondary | ICD-10-CM | POA: Diagnosis not present

## 2019-02-23 DIAGNOSIS — G8918 Other acute postprocedural pain: Secondary | ICD-10-CM | POA: Diagnosis not present

## 2019-02-23 DIAGNOSIS — Z8673 Personal history of transient ischemic attack (TIA), and cerebral infarction without residual deficits: Secondary | ICD-10-CM | POA: Diagnosis not present

## 2019-02-23 DIAGNOSIS — Z20828 Contact with and (suspected) exposure to other viral communicable diseases: Secondary | ICD-10-CM | POA: Diagnosis not present

## 2019-02-23 DIAGNOSIS — X58XXXA Exposure to other specified factors, initial encounter: Secondary | ICD-10-CM | POA: Diagnosis not present

## 2019-02-23 DIAGNOSIS — Z7902 Long term (current) use of antithrombotics/antiplatelets: Secondary | ICD-10-CM | POA: Diagnosis not present

## 2019-02-23 DIAGNOSIS — Z9181 History of falling: Secondary | ICD-10-CM | POA: Diagnosis not present

## 2019-02-23 DIAGNOSIS — R531 Weakness: Secondary | ICD-10-CM | POA: Diagnosis not present

## 2019-02-24 DIAGNOSIS — R410 Disorientation, unspecified: Secondary | ICD-10-CM | POA: Diagnosis not present

## 2019-02-24 DIAGNOSIS — Z7401 Bed confinement status: Secondary | ICD-10-CM | POA: Diagnosis not present

## 2019-02-24 DIAGNOSIS — R531 Weakness: Secondary | ICD-10-CM | POA: Diagnosis not present

## 2019-02-26 DIAGNOSIS — I639 Cerebral infarction, unspecified: Secondary | ICD-10-CM | POA: Diagnosis not present

## 2019-02-26 DIAGNOSIS — I10 Essential (primary) hypertension: Secondary | ICD-10-CM | POA: Diagnosis not present

## 2019-02-26 DIAGNOSIS — S42472A Displaced transcondylar fracture of left humerus, initial encounter for closed fracture: Secondary | ICD-10-CM | POA: Diagnosis not present

## 2019-02-26 DIAGNOSIS — E785 Hyperlipidemia, unspecified: Secondary | ICD-10-CM | POA: Diagnosis not present

## 2019-02-28 DIAGNOSIS — Z20828 Contact with and (suspected) exposure to other viral communicable diseases: Secondary | ICD-10-CM | POA: Diagnosis not present

## 2019-03-07 DIAGNOSIS — Z20828 Contact with and (suspected) exposure to other viral communicable diseases: Secondary | ICD-10-CM | POA: Diagnosis not present

## 2019-03-14 DIAGNOSIS — Z8781 Personal history of (healed) traumatic fracture: Secondary | ICD-10-CM | POA: Diagnosis not present

## 2019-03-14 DIAGNOSIS — Z20828 Contact with and (suspected) exposure to other viral communicable diseases: Secondary | ICD-10-CM | POA: Diagnosis not present

## 2019-03-14 DIAGNOSIS — Z9889 Other specified postprocedural states: Secondary | ICD-10-CM | POA: Diagnosis not present

## 2019-03-14 DIAGNOSIS — W1839XD Other fall on same level, subsequent encounter: Secondary | ICD-10-CM | POA: Diagnosis not present

## 2019-03-14 DIAGNOSIS — S42412D Displaced simple supracondylar fracture without intercondylar fracture of left humerus, subsequent encounter for fracture with routine healing: Secondary | ICD-10-CM | POA: Diagnosis not present

## 2019-03-15 DIAGNOSIS — M6281 Muscle weakness (generalized): Secondary | ICD-10-CM | POA: Diagnosis not present

## 2019-03-15 DIAGNOSIS — R2689 Other abnormalities of gait and mobility: Secondary | ICD-10-CM | POA: Diagnosis not present

## 2019-03-15 DIAGNOSIS — S42215G Unspecified nondisplaced fracture of surgical neck of left humerus, subsequent encounter for fracture with delayed healing: Secondary | ICD-10-CM | POA: Diagnosis not present

## 2019-03-16 DIAGNOSIS — M6281 Muscle weakness (generalized): Secondary | ICD-10-CM | POA: Diagnosis not present

## 2019-03-16 DIAGNOSIS — R2689 Other abnormalities of gait and mobility: Secondary | ICD-10-CM | POA: Diagnosis not present

## 2019-03-16 DIAGNOSIS — S42215G Unspecified nondisplaced fracture of surgical neck of left humerus, subsequent encounter for fracture with delayed healing: Secondary | ICD-10-CM | POA: Diagnosis not present

## 2019-03-17 DIAGNOSIS — R2689 Other abnormalities of gait and mobility: Secondary | ICD-10-CM | POA: Diagnosis not present

## 2019-03-17 DIAGNOSIS — M6281 Muscle weakness (generalized): Secondary | ICD-10-CM | POA: Diagnosis not present

## 2019-03-17 DIAGNOSIS — S42215G Unspecified nondisplaced fracture of surgical neck of left humerus, subsequent encounter for fracture with delayed healing: Secondary | ICD-10-CM | POA: Diagnosis not present

## 2019-03-18 DIAGNOSIS — R2689 Other abnormalities of gait and mobility: Secondary | ICD-10-CM | POA: Diagnosis not present

## 2019-03-18 DIAGNOSIS — M6281 Muscle weakness (generalized): Secondary | ICD-10-CM | POA: Diagnosis not present

## 2019-03-18 DIAGNOSIS — S42215G Unspecified nondisplaced fracture of surgical neck of left humerus, subsequent encounter for fracture with delayed healing: Secondary | ICD-10-CM | POA: Diagnosis not present

## 2019-03-20 DIAGNOSIS — M6281 Muscle weakness (generalized): Secondary | ICD-10-CM | POA: Diagnosis not present

## 2019-03-20 DIAGNOSIS — Z20828 Contact with and (suspected) exposure to other viral communicable diseases: Secondary | ICD-10-CM | POA: Diagnosis not present

## 2019-03-20 DIAGNOSIS — S42215G Unspecified nondisplaced fracture of surgical neck of left humerus, subsequent encounter for fracture with delayed healing: Secondary | ICD-10-CM | POA: Diagnosis not present

## 2019-03-20 DIAGNOSIS — R2689 Other abnormalities of gait and mobility: Secondary | ICD-10-CM | POA: Diagnosis not present

## 2019-03-21 DIAGNOSIS — M6281 Muscle weakness (generalized): Secondary | ICD-10-CM | POA: Diagnosis not present

## 2019-03-21 DIAGNOSIS — R2689 Other abnormalities of gait and mobility: Secondary | ICD-10-CM | POA: Diagnosis not present

## 2019-03-21 DIAGNOSIS — S42215G Unspecified nondisplaced fracture of surgical neck of left humerus, subsequent encounter for fracture with delayed healing: Secondary | ICD-10-CM | POA: Diagnosis not present

## 2019-03-22 DIAGNOSIS — M6281 Muscle weakness (generalized): Secondary | ICD-10-CM | POA: Diagnosis not present

## 2019-03-22 DIAGNOSIS — S42215G Unspecified nondisplaced fracture of surgical neck of left humerus, subsequent encounter for fracture with delayed healing: Secondary | ICD-10-CM | POA: Diagnosis not present

## 2019-03-22 DIAGNOSIS — R2689 Other abnormalities of gait and mobility: Secondary | ICD-10-CM | POA: Diagnosis not present

## 2019-03-23 DIAGNOSIS — R2689 Other abnormalities of gait and mobility: Secondary | ICD-10-CM | POA: Diagnosis not present

## 2019-03-23 DIAGNOSIS — M6281 Muscle weakness (generalized): Secondary | ICD-10-CM | POA: Diagnosis not present

## 2019-03-23 DIAGNOSIS — S42215G Unspecified nondisplaced fracture of surgical neck of left humerus, subsequent encounter for fracture with delayed healing: Secondary | ICD-10-CM | POA: Diagnosis not present

## 2019-03-24 DIAGNOSIS — M6281 Muscle weakness (generalized): Secondary | ICD-10-CM | POA: Diagnosis not present

## 2019-03-24 DIAGNOSIS — S42215G Unspecified nondisplaced fracture of surgical neck of left humerus, subsequent encounter for fracture with delayed healing: Secondary | ICD-10-CM | POA: Diagnosis not present

## 2019-03-24 DIAGNOSIS — R2689 Other abnormalities of gait and mobility: Secondary | ICD-10-CM | POA: Diagnosis not present

## 2019-03-25 DIAGNOSIS — R2689 Other abnormalities of gait and mobility: Secondary | ICD-10-CM | POA: Diagnosis not present

## 2019-03-25 DIAGNOSIS — M6281 Muscle weakness (generalized): Secondary | ICD-10-CM | POA: Diagnosis not present

## 2019-03-25 DIAGNOSIS — S42215G Unspecified nondisplaced fracture of surgical neck of left humerus, subsequent encounter for fracture with delayed healing: Secondary | ICD-10-CM | POA: Diagnosis not present

## 2019-03-27 DIAGNOSIS — R2689 Other abnormalities of gait and mobility: Secondary | ICD-10-CM | POA: Diagnosis not present

## 2019-03-27 DIAGNOSIS — M6281 Muscle weakness (generalized): Secondary | ICD-10-CM | POA: Diagnosis not present

## 2019-03-27 DIAGNOSIS — S42215G Unspecified nondisplaced fracture of surgical neck of left humerus, subsequent encounter for fracture with delayed healing: Secondary | ICD-10-CM | POA: Diagnosis not present

## 2019-03-28 DIAGNOSIS — M6281 Muscle weakness (generalized): Secondary | ICD-10-CM | POA: Diagnosis not present

## 2019-03-28 DIAGNOSIS — S42215G Unspecified nondisplaced fracture of surgical neck of left humerus, subsequent encounter for fracture with delayed healing: Secondary | ICD-10-CM | POA: Diagnosis not present

## 2019-03-28 DIAGNOSIS — R2689 Other abnormalities of gait and mobility: Secondary | ICD-10-CM | POA: Diagnosis not present

## 2019-03-29 DIAGNOSIS — M6281 Muscle weakness (generalized): Secondary | ICD-10-CM | POA: Diagnosis not present

## 2019-03-29 DIAGNOSIS — R2689 Other abnormalities of gait and mobility: Secondary | ICD-10-CM | POA: Diagnosis not present

## 2019-03-29 DIAGNOSIS — S42215G Unspecified nondisplaced fracture of surgical neck of left humerus, subsequent encounter for fracture with delayed healing: Secondary | ICD-10-CM | POA: Diagnosis not present

## 2019-03-30 DIAGNOSIS — M6281 Muscle weakness (generalized): Secondary | ICD-10-CM | POA: Diagnosis not present

## 2019-03-30 DIAGNOSIS — S42215G Unspecified nondisplaced fracture of surgical neck of left humerus, subsequent encounter for fracture with delayed healing: Secondary | ICD-10-CM | POA: Diagnosis not present

## 2019-03-30 DIAGNOSIS — R2689 Other abnormalities of gait and mobility: Secondary | ICD-10-CM | POA: Diagnosis not present

## 2019-03-31 DIAGNOSIS — S42215G Unspecified nondisplaced fracture of surgical neck of left humerus, subsequent encounter for fracture with delayed healing: Secondary | ICD-10-CM | POA: Diagnosis not present

## 2019-03-31 DIAGNOSIS — R2689 Other abnormalities of gait and mobility: Secondary | ICD-10-CM | POA: Diagnosis not present

## 2019-03-31 DIAGNOSIS — M6281 Muscle weakness (generalized): Secondary | ICD-10-CM | POA: Diagnosis not present

## 2019-04-03 DIAGNOSIS — R2689 Other abnormalities of gait and mobility: Secondary | ICD-10-CM | POA: Diagnosis not present

## 2019-04-03 DIAGNOSIS — S42215G Unspecified nondisplaced fracture of surgical neck of left humerus, subsequent encounter for fracture with delayed healing: Secondary | ICD-10-CM | POA: Diagnosis not present

## 2019-04-03 DIAGNOSIS — M6281 Muscle weakness (generalized): Secondary | ICD-10-CM | POA: Diagnosis not present

## 2019-04-04 DIAGNOSIS — R2689 Other abnormalities of gait and mobility: Secondary | ICD-10-CM | POA: Diagnosis not present

## 2019-04-04 DIAGNOSIS — S42215G Unspecified nondisplaced fracture of surgical neck of left humerus, subsequent encounter for fracture with delayed healing: Secondary | ICD-10-CM | POA: Diagnosis not present

## 2019-04-04 DIAGNOSIS — M6281 Muscle weakness (generalized): Secondary | ICD-10-CM | POA: Diagnosis not present

## 2019-04-05 DIAGNOSIS — S42215G Unspecified nondisplaced fracture of surgical neck of left humerus, subsequent encounter for fracture with delayed healing: Secondary | ICD-10-CM | POA: Diagnosis not present

## 2019-04-05 DIAGNOSIS — M6281 Muscle weakness (generalized): Secondary | ICD-10-CM | POA: Diagnosis not present

## 2019-04-05 DIAGNOSIS — R2689 Other abnormalities of gait and mobility: Secondary | ICD-10-CM | POA: Diagnosis not present

## 2019-04-06 DIAGNOSIS — R2689 Other abnormalities of gait and mobility: Secondary | ICD-10-CM | POA: Diagnosis not present

## 2019-04-06 DIAGNOSIS — S42215G Unspecified nondisplaced fracture of surgical neck of left humerus, subsequent encounter for fracture with delayed healing: Secondary | ICD-10-CM | POA: Diagnosis not present

## 2019-04-06 DIAGNOSIS — M6281 Muscle weakness (generalized): Secondary | ICD-10-CM | POA: Diagnosis not present

## 2019-04-07 DIAGNOSIS — S42215G Unspecified nondisplaced fracture of surgical neck of left humerus, subsequent encounter for fracture with delayed healing: Secondary | ICD-10-CM | POA: Diagnosis not present

## 2019-04-07 DIAGNOSIS — R2689 Other abnormalities of gait and mobility: Secondary | ICD-10-CM | POA: Diagnosis not present

## 2019-04-07 DIAGNOSIS — M6281 Muscle weakness (generalized): Secondary | ICD-10-CM | POA: Diagnosis not present

## 2019-04-08 DIAGNOSIS — M6281 Muscle weakness (generalized): Secondary | ICD-10-CM | POA: Diagnosis not present

## 2019-04-08 DIAGNOSIS — S42215G Unspecified nondisplaced fracture of surgical neck of left humerus, subsequent encounter for fracture with delayed healing: Secondary | ICD-10-CM | POA: Diagnosis not present

## 2019-04-08 DIAGNOSIS — R2689 Other abnormalities of gait and mobility: Secondary | ICD-10-CM | POA: Diagnosis not present

## 2019-04-10 DIAGNOSIS — R2689 Other abnormalities of gait and mobility: Secondary | ICD-10-CM | POA: Diagnosis not present

## 2019-04-10 DIAGNOSIS — M6281 Muscle weakness (generalized): Secondary | ICD-10-CM | POA: Diagnosis not present

## 2019-04-10 DIAGNOSIS — S42215G Unspecified nondisplaced fracture of surgical neck of left humerus, subsequent encounter for fracture with delayed healing: Secondary | ICD-10-CM | POA: Diagnosis not present

## 2019-04-11 DIAGNOSIS — S42215G Unspecified nondisplaced fracture of surgical neck of left humerus, subsequent encounter for fracture with delayed healing: Secondary | ICD-10-CM | POA: Diagnosis not present

## 2019-04-11 DIAGNOSIS — M6281 Muscle weakness (generalized): Secondary | ICD-10-CM | POA: Diagnosis not present

## 2019-04-11 DIAGNOSIS — W1839XD Other fall on same level, subsequent encounter: Secondary | ICD-10-CM | POA: Diagnosis not present

## 2019-04-11 DIAGNOSIS — S42412D Displaced simple supracondylar fracture without intercondylar fracture of left humerus, subsequent encounter for fracture with routine healing: Secondary | ICD-10-CM | POA: Diagnosis not present

## 2019-04-11 DIAGNOSIS — Z8781 Personal history of (healed) traumatic fracture: Secondary | ICD-10-CM | POA: Diagnosis not present

## 2019-04-11 DIAGNOSIS — Z9889 Other specified postprocedural states: Secondary | ICD-10-CM | POA: Diagnosis not present

## 2019-04-11 DIAGNOSIS — Z20828 Contact with and (suspected) exposure to other viral communicable diseases: Secondary | ICD-10-CM | POA: Diagnosis not present

## 2019-04-11 DIAGNOSIS — R2689 Other abnormalities of gait and mobility: Secondary | ICD-10-CM | POA: Diagnosis not present

## 2019-04-12 DIAGNOSIS — R2689 Other abnormalities of gait and mobility: Secondary | ICD-10-CM | POA: Diagnosis not present

## 2019-04-12 DIAGNOSIS — M6281 Muscle weakness (generalized): Secondary | ICD-10-CM | POA: Diagnosis not present

## 2019-04-12 DIAGNOSIS — S42215G Unspecified nondisplaced fracture of surgical neck of left humerus, subsequent encounter for fracture with delayed healing: Secondary | ICD-10-CM | POA: Diagnosis not present

## 2019-04-13 DIAGNOSIS — M6281 Muscle weakness (generalized): Secondary | ICD-10-CM | POA: Diagnosis not present

## 2019-04-13 DIAGNOSIS — R2689 Other abnormalities of gait and mobility: Secondary | ICD-10-CM | POA: Diagnosis not present

## 2019-04-13 DIAGNOSIS — S42215G Unspecified nondisplaced fracture of surgical neck of left humerus, subsequent encounter for fracture with delayed healing: Secondary | ICD-10-CM | POA: Diagnosis not present

## 2019-04-14 DIAGNOSIS — M6281 Muscle weakness (generalized): Secondary | ICD-10-CM | POA: Diagnosis not present

## 2019-04-14 DIAGNOSIS — R2689 Other abnormalities of gait and mobility: Secondary | ICD-10-CM | POA: Diagnosis not present

## 2019-04-14 DIAGNOSIS — S42215G Unspecified nondisplaced fracture of surgical neck of left humerus, subsequent encounter for fracture with delayed healing: Secondary | ICD-10-CM | POA: Diagnosis not present

## 2019-04-15 DIAGNOSIS — R2689 Other abnormalities of gait and mobility: Secondary | ICD-10-CM | POA: Diagnosis not present

## 2019-04-15 DIAGNOSIS — S42215G Unspecified nondisplaced fracture of surgical neck of left humerus, subsequent encounter for fracture with delayed healing: Secondary | ICD-10-CM | POA: Diagnosis not present

## 2019-04-15 DIAGNOSIS — M6281 Muscle weakness (generalized): Secondary | ICD-10-CM | POA: Diagnosis not present

## 2019-04-16 DIAGNOSIS — M6281 Muscle weakness (generalized): Secondary | ICD-10-CM | POA: Diagnosis not present

## 2019-04-16 DIAGNOSIS — S42215G Unspecified nondisplaced fracture of surgical neck of left humerus, subsequent encounter for fracture with delayed healing: Secondary | ICD-10-CM | POA: Diagnosis not present

## 2019-04-16 DIAGNOSIS — R2689 Other abnormalities of gait and mobility: Secondary | ICD-10-CM | POA: Diagnosis not present

## 2019-04-17 DIAGNOSIS — R2689 Other abnormalities of gait and mobility: Secondary | ICD-10-CM | POA: Diagnosis not present

## 2019-04-17 DIAGNOSIS — Z20828 Contact with and (suspected) exposure to other viral communicable diseases: Secondary | ICD-10-CM | POA: Diagnosis not present

## 2019-04-17 DIAGNOSIS — S42215G Unspecified nondisplaced fracture of surgical neck of left humerus, subsequent encounter for fracture with delayed healing: Secondary | ICD-10-CM | POA: Diagnosis not present

## 2019-04-17 DIAGNOSIS — M6281 Muscle weakness (generalized): Secondary | ICD-10-CM | POA: Diagnosis not present

## 2019-04-18 DIAGNOSIS — M6281 Muscle weakness (generalized): Secondary | ICD-10-CM | POA: Diagnosis not present

## 2019-04-18 DIAGNOSIS — R2689 Other abnormalities of gait and mobility: Secondary | ICD-10-CM | POA: Diagnosis not present

## 2019-04-18 DIAGNOSIS — S42215G Unspecified nondisplaced fracture of surgical neck of left humerus, subsequent encounter for fracture with delayed healing: Secondary | ICD-10-CM | POA: Diagnosis not present

## 2019-04-19 DIAGNOSIS — R2689 Other abnormalities of gait and mobility: Secondary | ICD-10-CM | POA: Diagnosis not present

## 2019-04-19 DIAGNOSIS — S42215G Unspecified nondisplaced fracture of surgical neck of left humerus, subsequent encounter for fracture with delayed healing: Secondary | ICD-10-CM | POA: Diagnosis not present

## 2019-04-19 DIAGNOSIS — M6281 Muscle weakness (generalized): Secondary | ICD-10-CM | POA: Diagnosis not present

## 2019-04-20 DIAGNOSIS — S42215G Unspecified nondisplaced fracture of surgical neck of left humerus, subsequent encounter for fracture with delayed healing: Secondary | ICD-10-CM | POA: Diagnosis not present

## 2019-04-20 DIAGNOSIS — E785 Hyperlipidemia, unspecified: Secondary | ICD-10-CM | POA: Diagnosis not present

## 2019-04-20 DIAGNOSIS — I10 Essential (primary) hypertension: Secondary | ICD-10-CM | POA: Diagnosis not present

## 2019-04-20 DIAGNOSIS — M6281 Muscle weakness (generalized): Secondary | ICD-10-CM | POA: Diagnosis not present

## 2019-04-20 DIAGNOSIS — S42472A Displaced transcondylar fracture of left humerus, initial encounter for closed fracture: Secondary | ICD-10-CM | POA: Diagnosis not present

## 2019-04-20 DIAGNOSIS — R2689 Other abnormalities of gait and mobility: Secondary | ICD-10-CM | POA: Diagnosis not present

## 2019-04-20 DIAGNOSIS — I639 Cerebral infarction, unspecified: Secondary | ICD-10-CM | POA: Diagnosis not present

## 2019-04-22 DIAGNOSIS — M6281 Muscle weakness (generalized): Secondary | ICD-10-CM | POA: Diagnosis not present

## 2019-04-22 DIAGNOSIS — R2689 Other abnormalities of gait and mobility: Secondary | ICD-10-CM | POA: Diagnosis not present

## 2019-04-22 DIAGNOSIS — S42215G Unspecified nondisplaced fracture of surgical neck of left humerus, subsequent encounter for fracture with delayed healing: Secondary | ICD-10-CM | POA: Diagnosis not present

## 2019-04-24 DIAGNOSIS — R2689 Other abnormalities of gait and mobility: Secondary | ICD-10-CM | POA: Diagnosis not present

## 2019-04-24 DIAGNOSIS — S42215G Unspecified nondisplaced fracture of surgical neck of left humerus, subsequent encounter for fracture with delayed healing: Secondary | ICD-10-CM | POA: Diagnosis not present

## 2019-04-24 DIAGNOSIS — M6281 Muscle weakness (generalized): Secondary | ICD-10-CM | POA: Diagnosis not present

## 2019-04-24 DIAGNOSIS — Z20828 Contact with and (suspected) exposure to other viral communicable diseases: Secondary | ICD-10-CM | POA: Diagnosis not present

## 2019-04-25 DIAGNOSIS — M6281 Muscle weakness (generalized): Secondary | ICD-10-CM | POA: Diagnosis not present

## 2019-04-25 DIAGNOSIS — R2689 Other abnormalities of gait and mobility: Secondary | ICD-10-CM | POA: Diagnosis not present

## 2019-04-25 DIAGNOSIS — S42215G Unspecified nondisplaced fracture of surgical neck of left humerus, subsequent encounter for fracture with delayed healing: Secondary | ICD-10-CM | POA: Diagnosis not present

## 2019-04-26 DIAGNOSIS — R2689 Other abnormalities of gait and mobility: Secondary | ICD-10-CM | POA: Diagnosis not present

## 2019-04-26 DIAGNOSIS — M6281 Muscle weakness (generalized): Secondary | ICD-10-CM | POA: Diagnosis not present

## 2019-04-26 DIAGNOSIS — S42215G Unspecified nondisplaced fracture of surgical neck of left humerus, subsequent encounter for fracture with delayed healing: Secondary | ICD-10-CM | POA: Diagnosis not present

## 2019-04-27 DIAGNOSIS — R2689 Other abnormalities of gait and mobility: Secondary | ICD-10-CM | POA: Diagnosis not present

## 2019-04-27 DIAGNOSIS — M6281 Muscle weakness (generalized): Secondary | ICD-10-CM | POA: Diagnosis not present

## 2019-04-27 DIAGNOSIS — S42215G Unspecified nondisplaced fracture of surgical neck of left humerus, subsequent encounter for fracture with delayed healing: Secondary | ICD-10-CM | POA: Diagnosis not present

## 2019-04-28 DIAGNOSIS — R2689 Other abnormalities of gait and mobility: Secondary | ICD-10-CM | POA: Diagnosis not present

## 2019-04-28 DIAGNOSIS — M6281 Muscle weakness (generalized): Secondary | ICD-10-CM | POA: Diagnosis not present

## 2019-04-28 DIAGNOSIS — S42215G Unspecified nondisplaced fracture of surgical neck of left humerus, subsequent encounter for fracture with delayed healing: Secondary | ICD-10-CM | POA: Diagnosis not present

## 2019-04-29 DIAGNOSIS — R2689 Other abnormalities of gait and mobility: Secondary | ICD-10-CM | POA: Diagnosis not present

## 2019-04-29 DIAGNOSIS — M6281 Muscle weakness (generalized): Secondary | ICD-10-CM | POA: Diagnosis not present

## 2019-04-29 DIAGNOSIS — S42215G Unspecified nondisplaced fracture of surgical neck of left humerus, subsequent encounter for fracture with delayed healing: Secondary | ICD-10-CM | POA: Diagnosis not present

## 2019-05-01 DIAGNOSIS — M6281 Muscle weakness (generalized): Secondary | ICD-10-CM | POA: Diagnosis not present

## 2019-05-01 DIAGNOSIS — R2689 Other abnormalities of gait and mobility: Secondary | ICD-10-CM | POA: Diagnosis not present

## 2019-05-01 DIAGNOSIS — S42215G Unspecified nondisplaced fracture of surgical neck of left humerus, subsequent encounter for fracture with delayed healing: Secondary | ICD-10-CM | POA: Diagnosis not present

## 2019-05-02 DIAGNOSIS — S42215G Unspecified nondisplaced fracture of surgical neck of left humerus, subsequent encounter for fracture with delayed healing: Secondary | ICD-10-CM | POA: Diagnosis not present

## 2019-05-02 DIAGNOSIS — U071 COVID-19: Secondary | ICD-10-CM | POA: Diagnosis not present

## 2019-05-02 DIAGNOSIS — M6281 Muscle weakness (generalized): Secondary | ICD-10-CM | POA: Diagnosis not present

## 2019-05-02 DIAGNOSIS — R2689 Other abnormalities of gait and mobility: Secondary | ICD-10-CM | POA: Diagnosis not present

## 2019-05-03 DIAGNOSIS — R2689 Other abnormalities of gait and mobility: Secondary | ICD-10-CM | POA: Diagnosis not present

## 2019-05-03 DIAGNOSIS — S42215G Unspecified nondisplaced fracture of surgical neck of left humerus, subsequent encounter for fracture with delayed healing: Secondary | ICD-10-CM | POA: Diagnosis not present

## 2019-05-03 DIAGNOSIS — M6281 Muscle weakness (generalized): Secondary | ICD-10-CM | POA: Diagnosis not present

## 2019-05-04 DIAGNOSIS — M6281 Muscle weakness (generalized): Secondary | ICD-10-CM | POA: Diagnosis not present

## 2019-05-04 DIAGNOSIS — S42215G Unspecified nondisplaced fracture of surgical neck of left humerus, subsequent encounter for fracture with delayed healing: Secondary | ICD-10-CM | POA: Diagnosis not present

## 2019-05-04 DIAGNOSIS — R2689 Other abnormalities of gait and mobility: Secondary | ICD-10-CM | POA: Diagnosis not present

## 2019-05-05 DIAGNOSIS — S42215G Unspecified nondisplaced fracture of surgical neck of left humerus, subsequent encounter for fracture with delayed healing: Secondary | ICD-10-CM | POA: Diagnosis not present

## 2019-05-05 DIAGNOSIS — R2689 Other abnormalities of gait and mobility: Secondary | ICD-10-CM | POA: Diagnosis not present

## 2019-05-05 DIAGNOSIS — M6281 Muscle weakness (generalized): Secondary | ICD-10-CM | POA: Diagnosis not present

## 2019-05-08 DIAGNOSIS — M6281 Muscle weakness (generalized): Secondary | ICD-10-CM | POA: Diagnosis not present

## 2019-05-08 DIAGNOSIS — S42215G Unspecified nondisplaced fracture of surgical neck of left humerus, subsequent encounter for fracture with delayed healing: Secondary | ICD-10-CM | POA: Diagnosis not present

## 2019-05-08 DIAGNOSIS — R2689 Other abnormalities of gait and mobility: Secondary | ICD-10-CM | POA: Diagnosis not present

## 2019-05-09 DIAGNOSIS — U071 COVID-19: Secondary | ICD-10-CM | POA: Diagnosis not present

## 2019-05-09 DIAGNOSIS — R2689 Other abnormalities of gait and mobility: Secondary | ICD-10-CM | POA: Diagnosis not present

## 2019-05-09 DIAGNOSIS — M6281 Muscle weakness (generalized): Secondary | ICD-10-CM | POA: Diagnosis not present

## 2019-05-09 DIAGNOSIS — S42215G Unspecified nondisplaced fracture of surgical neck of left humerus, subsequent encounter for fracture with delayed healing: Secondary | ICD-10-CM | POA: Diagnosis not present

## 2019-05-10 DIAGNOSIS — M6281 Muscle weakness (generalized): Secondary | ICD-10-CM | POA: Diagnosis not present

## 2019-05-10 DIAGNOSIS — S42215G Unspecified nondisplaced fracture of surgical neck of left humerus, subsequent encounter for fracture with delayed healing: Secondary | ICD-10-CM | POA: Diagnosis not present

## 2019-05-10 DIAGNOSIS — R2689 Other abnormalities of gait and mobility: Secondary | ICD-10-CM | POA: Diagnosis not present

## 2019-05-15 DIAGNOSIS — R2689 Other abnormalities of gait and mobility: Secondary | ICD-10-CM | POA: Diagnosis not present

## 2019-05-15 DIAGNOSIS — S42215G Unspecified nondisplaced fracture of surgical neck of left humerus, subsequent encounter for fracture with delayed healing: Secondary | ICD-10-CM | POA: Diagnosis not present

## 2019-05-15 DIAGNOSIS — M6281 Muscle weakness (generalized): Secondary | ICD-10-CM | POA: Diagnosis not present

## 2019-05-16 DIAGNOSIS — S42215G Unspecified nondisplaced fracture of surgical neck of left humerus, subsequent encounter for fracture with delayed healing: Secondary | ICD-10-CM | POA: Diagnosis not present

## 2019-05-16 DIAGNOSIS — M6281 Muscle weakness (generalized): Secondary | ICD-10-CM | POA: Diagnosis not present

## 2019-05-16 DIAGNOSIS — R2689 Other abnormalities of gait and mobility: Secondary | ICD-10-CM | POA: Diagnosis not present

## 2019-05-17 DIAGNOSIS — M6281 Muscle weakness (generalized): Secondary | ICD-10-CM | POA: Diagnosis not present

## 2019-05-17 DIAGNOSIS — S42215G Unspecified nondisplaced fracture of surgical neck of left humerus, subsequent encounter for fracture with delayed healing: Secondary | ICD-10-CM | POA: Diagnosis not present

## 2019-05-17 DIAGNOSIS — R2689 Other abnormalities of gait and mobility: Secondary | ICD-10-CM | POA: Diagnosis not present

## 2019-05-18 DIAGNOSIS — M6281 Muscle weakness (generalized): Secondary | ICD-10-CM | POA: Diagnosis not present

## 2019-05-18 DIAGNOSIS — R2689 Other abnormalities of gait and mobility: Secondary | ICD-10-CM | POA: Diagnosis not present

## 2019-05-18 DIAGNOSIS — S42215G Unspecified nondisplaced fracture of surgical neck of left humerus, subsequent encounter for fracture with delayed healing: Secondary | ICD-10-CM | POA: Diagnosis not present

## 2019-05-19 DIAGNOSIS — M6281 Muscle weakness (generalized): Secondary | ICD-10-CM | POA: Diagnosis not present

## 2019-05-19 DIAGNOSIS — R2689 Other abnormalities of gait and mobility: Secondary | ICD-10-CM | POA: Diagnosis not present

## 2019-05-19 DIAGNOSIS — S42215G Unspecified nondisplaced fracture of surgical neck of left humerus, subsequent encounter for fracture with delayed healing: Secondary | ICD-10-CM | POA: Diagnosis not present

## 2019-05-21 DIAGNOSIS — S42472A Displaced transcondylar fracture of left humerus, initial encounter for closed fracture: Secondary | ICD-10-CM | POA: Diagnosis not present

## 2019-05-21 DIAGNOSIS — I639 Cerebral infarction, unspecified: Secondary | ICD-10-CM | POA: Diagnosis not present

## 2019-05-21 DIAGNOSIS — E785 Hyperlipidemia, unspecified: Secondary | ICD-10-CM | POA: Diagnosis not present

## 2019-05-21 DIAGNOSIS — I10 Essential (primary) hypertension: Secondary | ICD-10-CM | POA: Diagnosis not present

## 2019-05-22 DIAGNOSIS — S42215G Unspecified nondisplaced fracture of surgical neck of left humerus, subsequent encounter for fracture with delayed healing: Secondary | ICD-10-CM | POA: Diagnosis not present

## 2019-05-22 DIAGNOSIS — M6281 Muscle weakness (generalized): Secondary | ICD-10-CM | POA: Diagnosis not present

## 2019-05-22 DIAGNOSIS — R2689 Other abnormalities of gait and mobility: Secondary | ICD-10-CM | POA: Diagnosis not present

## 2019-05-23 DIAGNOSIS — R2689 Other abnormalities of gait and mobility: Secondary | ICD-10-CM | POA: Diagnosis not present

## 2019-05-23 DIAGNOSIS — S42215G Unspecified nondisplaced fracture of surgical neck of left humerus, subsequent encounter for fracture with delayed healing: Secondary | ICD-10-CM | POA: Diagnosis not present

## 2019-05-23 DIAGNOSIS — M6281 Muscle weakness (generalized): Secondary | ICD-10-CM | POA: Diagnosis not present

## 2019-05-24 DIAGNOSIS — M6281 Muscle weakness (generalized): Secondary | ICD-10-CM | POA: Diagnosis not present

## 2019-05-24 DIAGNOSIS — R2689 Other abnormalities of gait and mobility: Secondary | ICD-10-CM | POA: Diagnosis not present

## 2019-05-24 DIAGNOSIS — S42215G Unspecified nondisplaced fracture of surgical neck of left humerus, subsequent encounter for fracture with delayed healing: Secondary | ICD-10-CM | POA: Diagnosis not present

## 2019-05-25 DIAGNOSIS — M6281 Muscle weakness (generalized): Secondary | ICD-10-CM | POA: Diagnosis not present

## 2019-05-25 DIAGNOSIS — R2689 Other abnormalities of gait and mobility: Secondary | ICD-10-CM | POA: Diagnosis not present

## 2019-05-25 DIAGNOSIS — S42215G Unspecified nondisplaced fracture of surgical neck of left humerus, subsequent encounter for fracture with delayed healing: Secondary | ICD-10-CM | POA: Diagnosis not present

## 2019-05-26 DIAGNOSIS — M6281 Muscle weakness (generalized): Secondary | ICD-10-CM | POA: Diagnosis not present

## 2019-05-26 DIAGNOSIS — S42215G Unspecified nondisplaced fracture of surgical neck of left humerus, subsequent encounter for fracture with delayed healing: Secondary | ICD-10-CM | POA: Diagnosis not present

## 2019-05-26 DIAGNOSIS — R2689 Other abnormalities of gait and mobility: Secondary | ICD-10-CM | POA: Diagnosis not present

## 2019-06-23 DIAGNOSIS — I10 Essential (primary) hypertension: Secondary | ICD-10-CM | POA: Diagnosis not present

## 2019-06-23 DIAGNOSIS — S42472A Displaced transcondylar fracture of left humerus, initial encounter for closed fracture: Secondary | ICD-10-CM | POA: Diagnosis not present

## 2019-06-23 DIAGNOSIS — E785 Hyperlipidemia, unspecified: Secondary | ICD-10-CM | POA: Diagnosis not present

## 2019-06-23 DIAGNOSIS — I639 Cerebral infarction, unspecified: Secondary | ICD-10-CM | POA: Diagnosis not present

## 2019-07-23 DIAGNOSIS — I639 Cerebral infarction, unspecified: Secondary | ICD-10-CM | POA: Diagnosis not present

## 2019-07-23 DIAGNOSIS — E785 Hyperlipidemia, unspecified: Secondary | ICD-10-CM | POA: Diagnosis not present

## 2019-07-23 DIAGNOSIS — S42472A Displaced transcondylar fracture of left humerus, initial encounter for closed fracture: Secondary | ICD-10-CM | POA: Diagnosis not present

## 2019-07-23 DIAGNOSIS — I10 Essential (primary) hypertension: Secondary | ICD-10-CM | POA: Diagnosis not present

## 2019-08-08 DIAGNOSIS — B351 Tinea unguium: Secondary | ICD-10-CM | POA: Diagnosis not present

## 2019-08-08 DIAGNOSIS — I739 Peripheral vascular disease, unspecified: Secondary | ICD-10-CM | POA: Diagnosis not present

## 2019-08-08 DIAGNOSIS — L603 Nail dystrophy: Secondary | ICD-10-CM | POA: Diagnosis not present

## 2019-08-08 DIAGNOSIS — L600 Ingrowing nail: Secondary | ICD-10-CM | POA: Diagnosis not present

## 2019-08-24 DIAGNOSIS — S42472A Displaced transcondylar fracture of left humerus, initial encounter for closed fracture: Secondary | ICD-10-CM | POA: Diagnosis not present

## 2019-08-24 DIAGNOSIS — I10 Essential (primary) hypertension: Secondary | ICD-10-CM | POA: Diagnosis not present

## 2019-08-24 DIAGNOSIS — I639 Cerebral infarction, unspecified: Secondary | ICD-10-CM | POA: Diagnosis not present

## 2019-08-24 DIAGNOSIS — E785 Hyperlipidemia, unspecified: Secondary | ICD-10-CM | POA: Diagnosis not present

## 2019-10-02 DIAGNOSIS — I10 Essential (primary) hypertension: Secondary | ICD-10-CM | POA: Diagnosis not present

## 2019-10-02 DIAGNOSIS — F330 Major depressive disorder, recurrent, mild: Secondary | ICD-10-CM | POA: Diagnosis not present

## 2019-10-02 DIAGNOSIS — E785 Hyperlipidemia, unspecified: Secondary | ICD-10-CM | POA: Diagnosis not present

## 2019-10-12 DIAGNOSIS — I10 Essential (primary) hypertension: Secondary | ICD-10-CM | POA: Diagnosis not present

## 2019-11-02 DIAGNOSIS — F330 Major depressive disorder, recurrent, mild: Secondary | ICD-10-CM | POA: Diagnosis not present

## 2019-11-02 DIAGNOSIS — E785 Hyperlipidemia, unspecified: Secondary | ICD-10-CM | POA: Diagnosis not present

## 2019-11-02 DIAGNOSIS — I10 Essential (primary) hypertension: Secondary | ICD-10-CM | POA: Diagnosis not present

## 2019-11-21 DIAGNOSIS — R05 Cough: Secondary | ICD-10-CM | POA: Diagnosis not present

## 2019-12-03 DIAGNOSIS — F330 Major depressive disorder, recurrent, mild: Secondary | ICD-10-CM | POA: Diagnosis not present

## 2019-12-03 DIAGNOSIS — I10 Essential (primary) hypertension: Secondary | ICD-10-CM | POA: Diagnosis not present

## 2019-12-03 DIAGNOSIS — E785 Hyperlipidemia, unspecified: Secondary | ICD-10-CM | POA: Diagnosis not present

## 2020-01-04 DIAGNOSIS — I10 Essential (primary) hypertension: Secondary | ICD-10-CM | POA: Diagnosis not present

## 2020-01-04 DIAGNOSIS — F330 Major depressive disorder, recurrent, mild: Secondary | ICD-10-CM | POA: Diagnosis not present

## 2020-01-04 DIAGNOSIS — E785 Hyperlipidemia, unspecified: Secondary | ICD-10-CM | POA: Diagnosis not present

## 2020-01-18 DIAGNOSIS — Z23 Encounter for immunization: Secondary | ICD-10-CM | POA: Diagnosis not present

## 2020-01-23 DIAGNOSIS — B351 Tinea unguium: Secondary | ICD-10-CM | POA: Diagnosis not present

## 2020-01-23 DIAGNOSIS — I739 Peripheral vascular disease, unspecified: Secondary | ICD-10-CM | POA: Diagnosis not present

## 2020-01-23 DIAGNOSIS — L853 Xerosis cutis: Secondary | ICD-10-CM | POA: Diagnosis not present

## 2020-01-23 DIAGNOSIS — R600 Localized edema: Secondary | ICD-10-CM | POA: Diagnosis not present

## 2020-02-04 DIAGNOSIS — I10 Essential (primary) hypertension: Secondary | ICD-10-CM | POA: Diagnosis not present

## 2020-02-04 DIAGNOSIS — E785 Hyperlipidemia, unspecified: Secondary | ICD-10-CM | POA: Diagnosis not present

## 2020-02-04 DIAGNOSIS — F330 Major depressive disorder, recurrent, mild: Secondary | ICD-10-CM | POA: Diagnosis not present

## 2020-03-06 DIAGNOSIS — E785 Hyperlipidemia, unspecified: Secondary | ICD-10-CM | POA: Diagnosis not present

## 2020-03-06 DIAGNOSIS — F330 Major depressive disorder, recurrent, mild: Secondary | ICD-10-CM | POA: Diagnosis not present

## 2020-03-06 DIAGNOSIS — I10 Essential (primary) hypertension: Secondary | ICD-10-CM | POA: Diagnosis not present

## 2020-04-08 DIAGNOSIS — I10 Essential (primary) hypertension: Secondary | ICD-10-CM | POA: Diagnosis not present

## 2020-04-08 DIAGNOSIS — E785 Hyperlipidemia, unspecified: Secondary | ICD-10-CM | POA: Diagnosis not present

## 2020-04-08 DIAGNOSIS — F330 Major depressive disorder, recurrent, mild: Secondary | ICD-10-CM | POA: Diagnosis not present

## 2020-05-09 DIAGNOSIS — E785 Hyperlipidemia, unspecified: Secondary | ICD-10-CM | POA: Diagnosis not present

## 2020-05-09 DIAGNOSIS — I10 Essential (primary) hypertension: Secondary | ICD-10-CM | POA: Diagnosis not present

## 2020-05-09 DIAGNOSIS — F330 Major depressive disorder, recurrent, mild: Secondary | ICD-10-CM | POA: Diagnosis not present

## 2020-06-10 DIAGNOSIS — I10 Essential (primary) hypertension: Secondary | ICD-10-CM | POA: Diagnosis not present

## 2020-06-10 DIAGNOSIS — E785 Hyperlipidemia, unspecified: Secondary | ICD-10-CM | POA: Diagnosis not present

## 2020-06-10 DIAGNOSIS — F330 Major depressive disorder, recurrent, mild: Secondary | ICD-10-CM | POA: Diagnosis not present

## 2020-07-09 DIAGNOSIS — E785 Hyperlipidemia, unspecified: Secondary | ICD-10-CM | POA: Diagnosis not present

## 2020-07-09 DIAGNOSIS — F330 Major depressive disorder, recurrent, mild: Secondary | ICD-10-CM | POA: Diagnosis not present

## 2020-07-09 DIAGNOSIS — I10 Essential (primary) hypertension: Secondary | ICD-10-CM | POA: Diagnosis not present

## 2020-07-25 DIAGNOSIS — I739 Peripheral vascular disease, unspecified: Secondary | ICD-10-CM | POA: Diagnosis not present

## 2020-07-25 DIAGNOSIS — L602 Onychogryphosis: Secondary | ICD-10-CM | POA: Diagnosis not present

## 2020-08-08 DIAGNOSIS — F330 Major depressive disorder, recurrent, mild: Secondary | ICD-10-CM | POA: Diagnosis not present

## 2020-08-08 DIAGNOSIS — E785 Hyperlipidemia, unspecified: Secondary | ICD-10-CM | POA: Diagnosis not present

## 2020-08-08 DIAGNOSIS — I10 Essential (primary) hypertension: Secondary | ICD-10-CM | POA: Diagnosis not present

## 2020-08-20 DIAGNOSIS — R2689 Other abnormalities of gait and mobility: Secondary | ICD-10-CM | POA: Diagnosis not present

## 2020-08-20 DIAGNOSIS — M6281 Muscle weakness (generalized): Secondary | ICD-10-CM | POA: Diagnosis not present

## 2020-08-24 IMAGING — DX DG CHEST 1V PORT
1 series · 1 of 1 positions shown · non-contrast
Comparison: August 11, 2015

CLINICAL DATA: Fall

EXAM:
PORTABLE CHEST 1 VIEW

[chest ap]
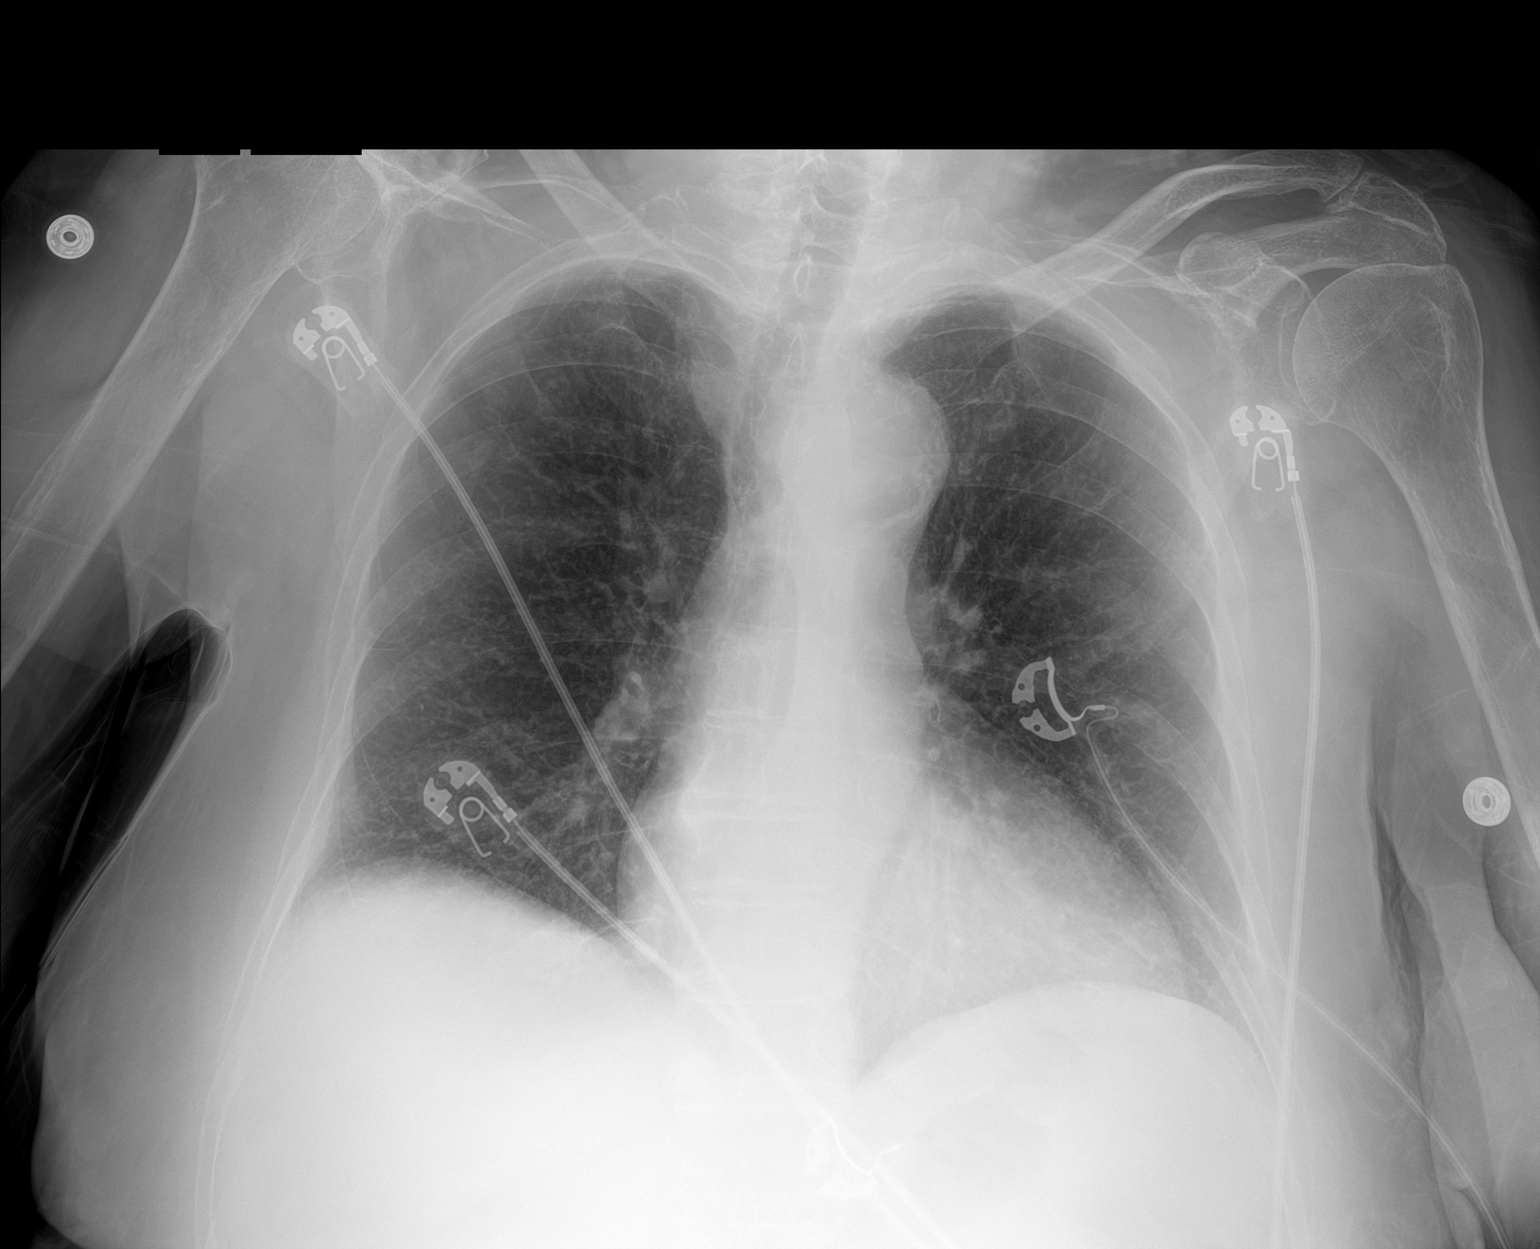

[1 of 1 positions shown; findings below may reference images not displayed]

FINDINGS: The heart size and mediastinal contours are within normal limits.
Both lungs are clear. The visualized skeletal structures are
unremarkable.
IMPRESSION: No acute cardiopulmonary process.

## 2020-08-24 IMAGING — DX DG HUMERUS 2V *L*
2 series · 3 of 3 positions shown · non-contrast
Comparison: None.

CLINICAL DATA: Pain after fall

EXAM:
LEFT HUMERUS - 2+ VIEW

[humerus ap]
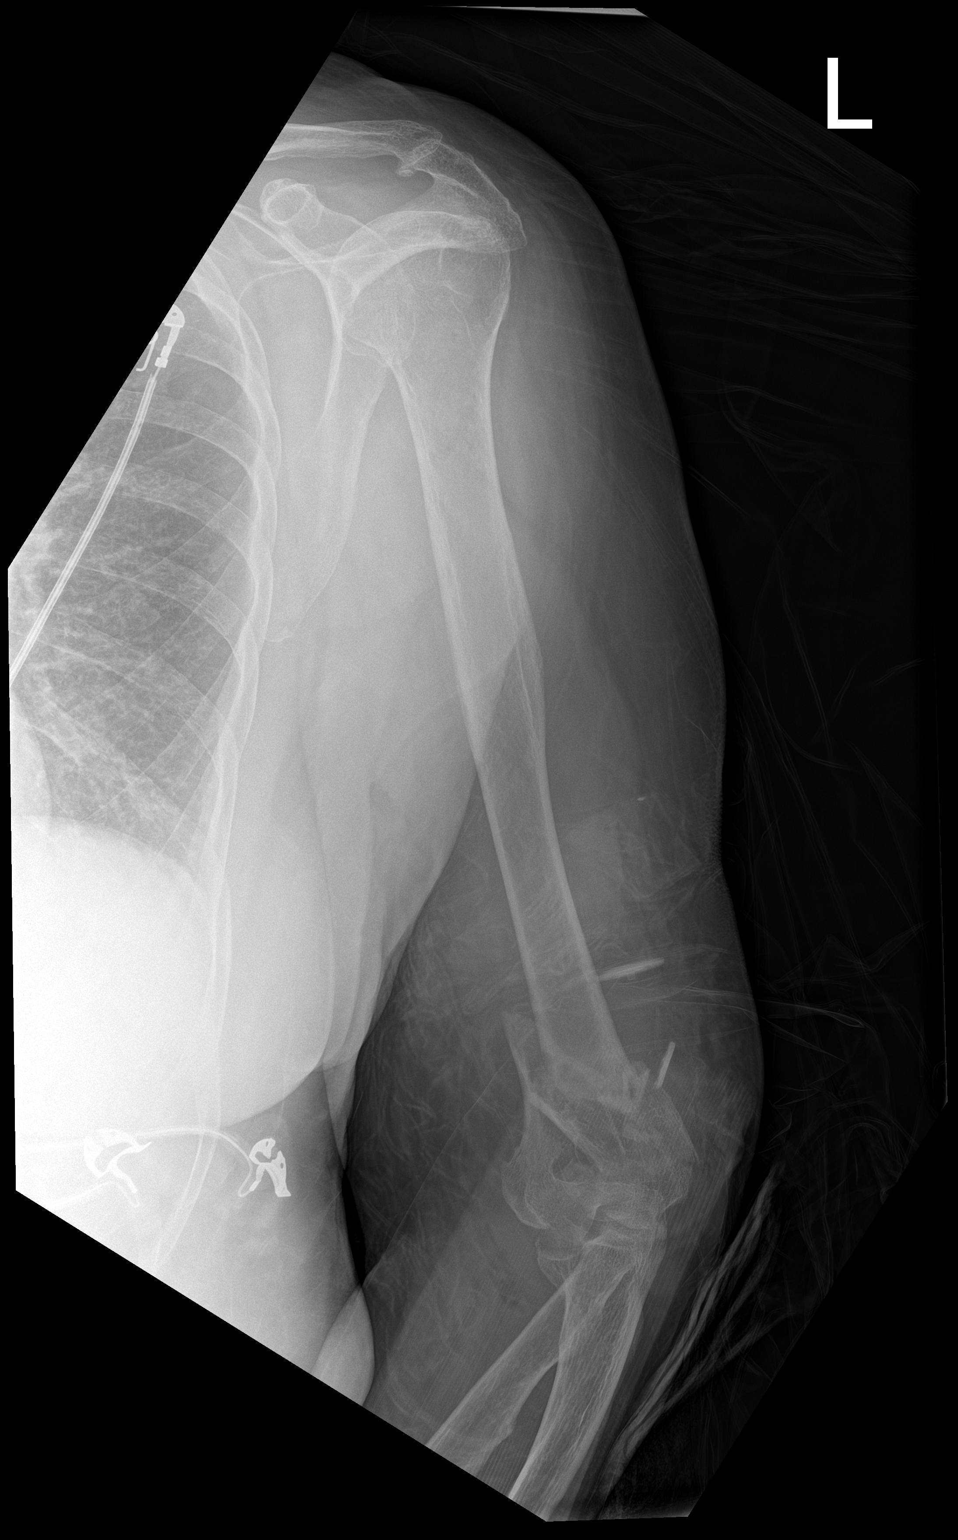

[Series 2: humerus lat · 0.14mm/px · 2 of 2 slices shown]
[im 1/2]
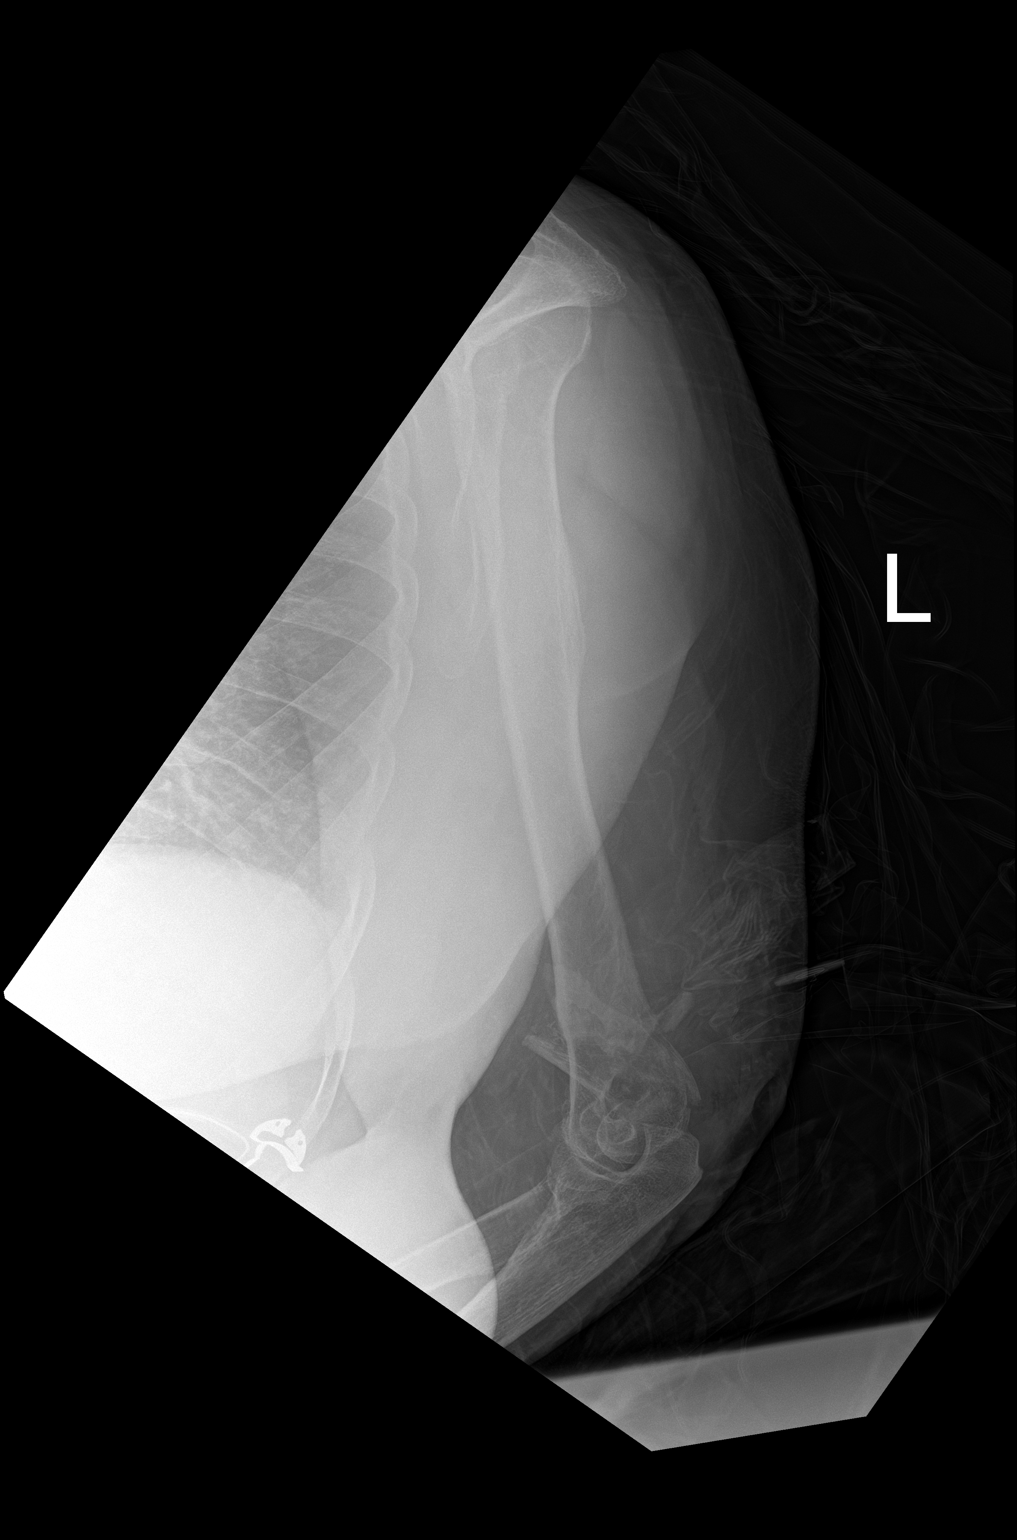
[im 2/2]
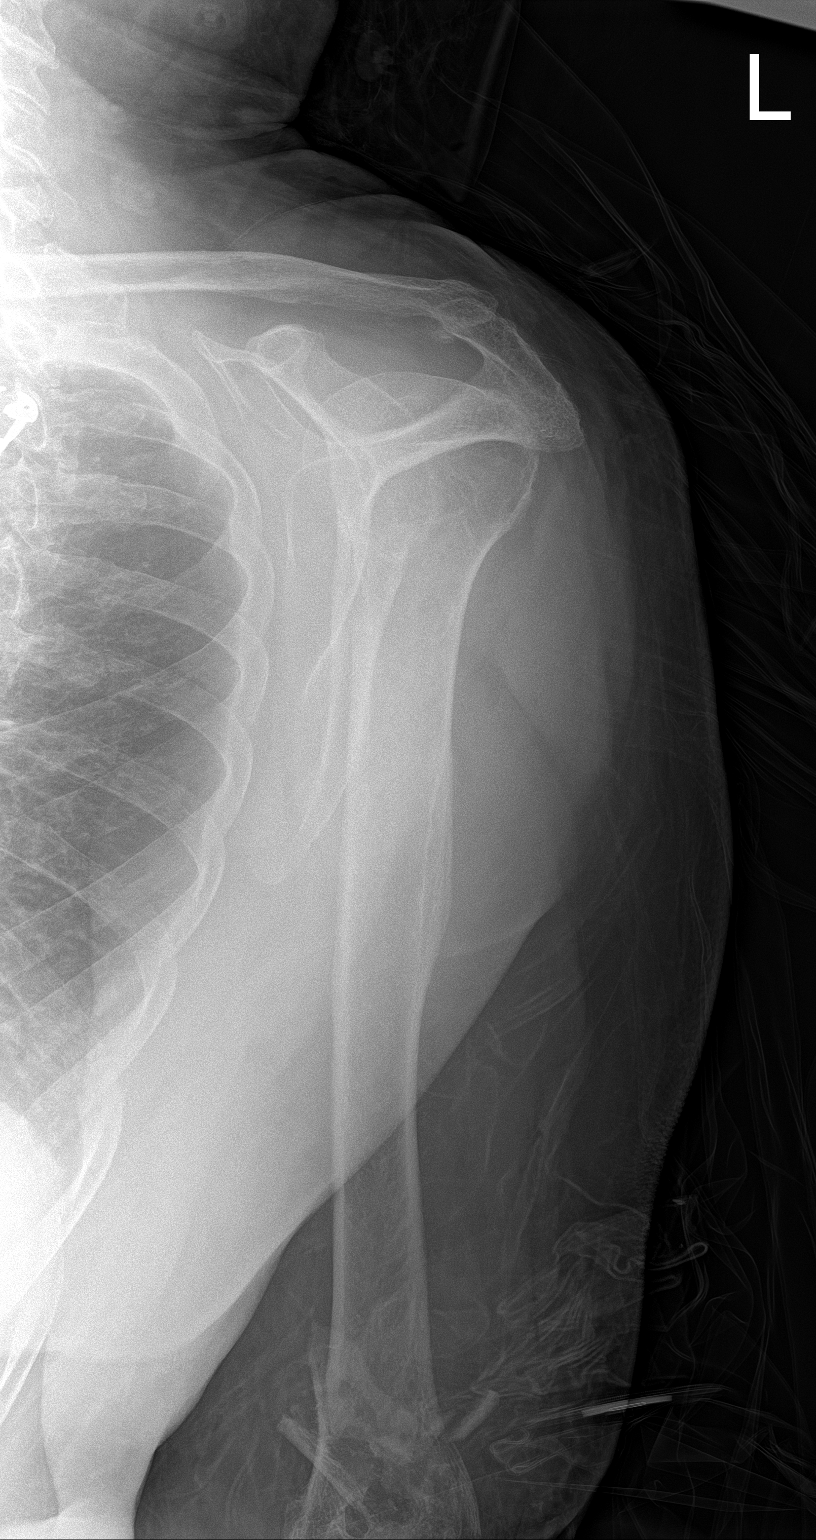

[3 of 3 positions shown; findings below may reference images not displayed]

FINDINGS: There is comminuted impacted intra-articular distal supracondylar
and lateral condylar fracture. There is a small fracture fragment
seen within the anterolateral soft tissues. Diffuse osteopenia.
IMPRESSION: Comminuted intra-articular distal humeral and lateral epicondyle
fracture.

## 2020-08-26 DIAGNOSIS — M6281 Muscle weakness (generalized): Secondary | ICD-10-CM | POA: Diagnosis not present

## 2020-08-26 DIAGNOSIS — R2689 Other abnormalities of gait and mobility: Secondary | ICD-10-CM | POA: Diagnosis not present

## 2020-08-27 DIAGNOSIS — R2689 Other abnormalities of gait and mobility: Secondary | ICD-10-CM | POA: Diagnosis not present

## 2020-08-27 DIAGNOSIS — M6281 Muscle weakness (generalized): Secondary | ICD-10-CM | POA: Diagnosis not present

## 2020-08-28 DIAGNOSIS — M6281 Muscle weakness (generalized): Secondary | ICD-10-CM | POA: Diagnosis not present

## 2020-08-28 DIAGNOSIS — R2689 Other abnormalities of gait and mobility: Secondary | ICD-10-CM | POA: Diagnosis not present

## 2020-08-29 DIAGNOSIS — R2689 Other abnormalities of gait and mobility: Secondary | ICD-10-CM | POA: Diagnosis not present

## 2020-08-29 DIAGNOSIS — M6281 Muscle weakness (generalized): Secondary | ICD-10-CM | POA: Diagnosis not present

## 2020-09-03 DIAGNOSIS — R2689 Other abnormalities of gait and mobility: Secondary | ICD-10-CM | POA: Diagnosis not present

## 2020-09-03 DIAGNOSIS — M6281 Muscle weakness (generalized): Secondary | ICD-10-CM | POA: Diagnosis not present

## 2020-09-04 DIAGNOSIS — R2689 Other abnormalities of gait and mobility: Secondary | ICD-10-CM | POA: Diagnosis not present

## 2020-09-04 DIAGNOSIS — M6281 Muscle weakness (generalized): Secondary | ICD-10-CM | POA: Diagnosis not present

## 2020-09-05 DIAGNOSIS — R2689 Other abnormalities of gait and mobility: Secondary | ICD-10-CM | POA: Diagnosis not present

## 2020-09-05 DIAGNOSIS — M6281 Muscle weakness (generalized): Secondary | ICD-10-CM | POA: Diagnosis not present

## 2020-09-06 DIAGNOSIS — R2689 Other abnormalities of gait and mobility: Secondary | ICD-10-CM | POA: Diagnosis not present

## 2020-09-06 DIAGNOSIS — M6281 Muscle weakness (generalized): Secondary | ICD-10-CM | POA: Diagnosis not present

## 2020-09-07 DIAGNOSIS — M6281 Muscle weakness (generalized): Secondary | ICD-10-CM | POA: Diagnosis not present

## 2020-09-07 DIAGNOSIS — R2689 Other abnormalities of gait and mobility: Secondary | ICD-10-CM | POA: Diagnosis not present

## 2020-09-11 DIAGNOSIS — I10 Essential (primary) hypertension: Secondary | ICD-10-CM | POA: Diagnosis not present

## 2020-09-11 DIAGNOSIS — E785 Hyperlipidemia, unspecified: Secondary | ICD-10-CM | POA: Diagnosis not present

## 2020-09-11 DIAGNOSIS — F330 Major depressive disorder, recurrent, mild: Secondary | ICD-10-CM | POA: Diagnosis not present

## 2020-09-27 DIAGNOSIS — M6281 Muscle weakness (generalized): Secondary | ICD-10-CM | POA: Diagnosis not present

## 2020-09-27 DIAGNOSIS — R41841 Cognitive communication deficit: Secondary | ICD-10-CM | POA: Diagnosis not present

## 2020-09-27 DIAGNOSIS — S42411B Displaced simple supracondylar fracture without intercondylar fracture of right humerus, initial encounter for open fracture: Secondary | ICD-10-CM | POA: Diagnosis not present

## 2020-09-27 DIAGNOSIS — R2689 Other abnormalities of gait and mobility: Secondary | ICD-10-CM | POA: Diagnosis not present

## 2020-10-09 DIAGNOSIS — M6281 Muscle weakness (generalized): Secondary | ICD-10-CM | POA: Diagnosis not present

## 2020-10-09 DIAGNOSIS — F330 Major depressive disorder, recurrent, mild: Secondary | ICD-10-CM | POA: Diagnosis not present

## 2020-10-09 DIAGNOSIS — I10 Essential (primary) hypertension: Secondary | ICD-10-CM | POA: Diagnosis not present

## 2020-10-09 DIAGNOSIS — S42411B Displaced simple supracondylar fracture without intercondylar fracture of right humerus, initial encounter for open fracture: Secondary | ICD-10-CM | POA: Diagnosis not present

## 2020-10-09 DIAGNOSIS — R41841 Cognitive communication deficit: Secondary | ICD-10-CM | POA: Diagnosis not present

## 2020-10-09 DIAGNOSIS — R2689 Other abnormalities of gait and mobility: Secondary | ICD-10-CM | POA: Diagnosis not present

## 2020-10-09 DIAGNOSIS — E785 Hyperlipidemia, unspecified: Secondary | ICD-10-CM | POA: Diagnosis not present

## 2020-10-26 DIAGNOSIS — R2689 Other abnormalities of gait and mobility: Secondary | ICD-10-CM | POA: Diagnosis not present

## 2020-10-26 DIAGNOSIS — S42411B Displaced simple supracondylar fracture without intercondylar fracture of right humerus, initial encounter for open fracture: Secondary | ICD-10-CM | POA: Diagnosis not present

## 2020-10-26 DIAGNOSIS — R41841 Cognitive communication deficit: Secondary | ICD-10-CM | POA: Diagnosis not present

## 2020-10-26 DIAGNOSIS — M6281 Muscle weakness (generalized): Secondary | ICD-10-CM | POA: Diagnosis not present

## 2020-10-29 DIAGNOSIS — M6281 Muscle weakness (generalized): Secondary | ICD-10-CM | POA: Diagnosis not present

## 2020-10-29 DIAGNOSIS — R41841 Cognitive communication deficit: Secondary | ICD-10-CM | POA: Diagnosis not present

## 2020-10-29 DIAGNOSIS — S42411B Displaced simple supracondylar fracture without intercondylar fracture of right humerus, initial encounter for open fracture: Secondary | ICD-10-CM | POA: Diagnosis not present

## 2020-10-29 DIAGNOSIS — R2689 Other abnormalities of gait and mobility: Secondary | ICD-10-CM | POA: Diagnosis not present

## 2020-10-30 DIAGNOSIS — R2689 Other abnormalities of gait and mobility: Secondary | ICD-10-CM | POA: Diagnosis not present

## 2020-10-30 DIAGNOSIS — S42411B Displaced simple supracondylar fracture without intercondylar fracture of right humerus, initial encounter for open fracture: Secondary | ICD-10-CM | POA: Diagnosis not present

## 2020-10-30 DIAGNOSIS — M6281 Muscle weakness (generalized): Secondary | ICD-10-CM | POA: Diagnosis not present

## 2020-10-30 DIAGNOSIS — R41841 Cognitive communication deficit: Secondary | ICD-10-CM | POA: Diagnosis not present

## 2020-10-31 DIAGNOSIS — M6281 Muscle weakness (generalized): Secondary | ICD-10-CM | POA: Diagnosis not present

## 2020-10-31 DIAGNOSIS — S42411B Displaced simple supracondylar fracture without intercondylar fracture of right humerus, initial encounter for open fracture: Secondary | ICD-10-CM | POA: Diagnosis not present

## 2020-10-31 DIAGNOSIS — R2689 Other abnormalities of gait and mobility: Secondary | ICD-10-CM | POA: Diagnosis not present

## 2020-10-31 DIAGNOSIS — R41841 Cognitive communication deficit: Secondary | ICD-10-CM | POA: Diagnosis not present

## 2020-11-05 DIAGNOSIS — H2511 Age-related nuclear cataract, right eye: Secondary | ICD-10-CM | POA: Diagnosis not present

## 2020-11-05 DIAGNOSIS — S42411B Displaced simple supracondylar fracture without intercondylar fracture of right humerus, initial encounter for open fracture: Secondary | ICD-10-CM | POA: Diagnosis not present

## 2020-11-05 DIAGNOSIS — H2512 Age-related nuclear cataract, left eye: Secondary | ICD-10-CM | POA: Diagnosis not present

## 2020-11-05 DIAGNOSIS — R41841 Cognitive communication deficit: Secondary | ICD-10-CM | POA: Diagnosis not present

## 2020-11-05 DIAGNOSIS — M6281 Muscle weakness (generalized): Secondary | ICD-10-CM | POA: Diagnosis not present

## 2020-11-05 DIAGNOSIS — Z01818 Encounter for other preprocedural examination: Secondary | ICD-10-CM | POA: Diagnosis not present

## 2020-11-05 DIAGNOSIS — R2689 Other abnormalities of gait and mobility: Secondary | ICD-10-CM | POA: Diagnosis not present

## 2020-11-05 DIAGNOSIS — H353231 Exudative age-related macular degeneration, bilateral, with active choroidal neovascularization: Secondary | ICD-10-CM | POA: Diagnosis not present

## 2020-11-07 DIAGNOSIS — R2689 Other abnormalities of gait and mobility: Secondary | ICD-10-CM | POA: Diagnosis not present

## 2020-11-07 DIAGNOSIS — M6281 Muscle weakness (generalized): Secondary | ICD-10-CM | POA: Diagnosis not present

## 2020-11-07 DIAGNOSIS — R41841 Cognitive communication deficit: Secondary | ICD-10-CM | POA: Diagnosis not present

## 2020-11-07 DIAGNOSIS — S42411B Displaced simple supracondylar fracture without intercondylar fracture of right humerus, initial encounter for open fracture: Secondary | ICD-10-CM | POA: Diagnosis not present

## 2020-11-11 DIAGNOSIS — S42411B Displaced simple supracondylar fracture without intercondylar fracture of right humerus, initial encounter for open fracture: Secondary | ICD-10-CM | POA: Diagnosis not present

## 2020-11-11 DIAGNOSIS — M6281 Muscle weakness (generalized): Secondary | ICD-10-CM | POA: Diagnosis not present

## 2020-11-11 DIAGNOSIS — R2689 Other abnormalities of gait and mobility: Secondary | ICD-10-CM | POA: Diagnosis not present

## 2020-11-11 DIAGNOSIS — R41841 Cognitive communication deficit: Secondary | ICD-10-CM | POA: Diagnosis not present

## 2020-11-12 DIAGNOSIS — R2689 Other abnormalities of gait and mobility: Secondary | ICD-10-CM | POA: Diagnosis not present

## 2020-11-12 DIAGNOSIS — S42411B Displaced simple supracondylar fracture without intercondylar fracture of right humerus, initial encounter for open fracture: Secondary | ICD-10-CM | POA: Diagnosis not present

## 2020-11-12 DIAGNOSIS — M6281 Muscle weakness (generalized): Secondary | ICD-10-CM | POA: Diagnosis not present

## 2020-11-12 DIAGNOSIS — R41841 Cognitive communication deficit: Secondary | ICD-10-CM | POA: Diagnosis not present

## 2020-11-13 DIAGNOSIS — R41841 Cognitive communication deficit: Secondary | ICD-10-CM | POA: Diagnosis not present

## 2020-11-13 DIAGNOSIS — R2689 Other abnormalities of gait and mobility: Secondary | ICD-10-CM | POA: Diagnosis not present

## 2020-11-13 DIAGNOSIS — M6281 Muscle weakness (generalized): Secondary | ICD-10-CM | POA: Diagnosis not present

## 2020-11-13 DIAGNOSIS — Z23 Encounter for immunization: Secondary | ICD-10-CM | POA: Diagnosis not present

## 2020-11-13 DIAGNOSIS — S42411B Displaced simple supracondylar fracture without intercondylar fracture of right humerus, initial encounter for open fracture: Secondary | ICD-10-CM | POA: Diagnosis not present

## 2020-11-14 DIAGNOSIS — R41841 Cognitive communication deficit: Secondary | ICD-10-CM | POA: Diagnosis not present

## 2020-11-14 DIAGNOSIS — F0390 Unspecified dementia without behavioral disturbance: Secondary | ICD-10-CM | POA: Diagnosis not present

## 2020-11-14 DIAGNOSIS — M6281 Muscle weakness (generalized): Secondary | ICD-10-CM | POA: Diagnosis not present

## 2020-11-14 DIAGNOSIS — F5101 Primary insomnia: Secondary | ICD-10-CM | POA: Diagnosis not present

## 2020-11-14 DIAGNOSIS — R2689 Other abnormalities of gait and mobility: Secondary | ICD-10-CM | POA: Diagnosis not present

## 2020-11-14 DIAGNOSIS — S42411B Displaced simple supracondylar fracture without intercondylar fracture of right humerus, initial encounter for open fracture: Secondary | ICD-10-CM | POA: Diagnosis not present

## 2020-11-14 DIAGNOSIS — F32A Depression, unspecified: Secondary | ICD-10-CM | POA: Diagnosis not present

## 2020-11-18 DIAGNOSIS — S42411B Displaced simple supracondylar fracture without intercondylar fracture of right humerus, initial encounter for open fracture: Secondary | ICD-10-CM | POA: Diagnosis not present

## 2020-11-18 DIAGNOSIS — M6281 Muscle weakness (generalized): Secondary | ICD-10-CM | POA: Diagnosis not present

## 2020-11-18 DIAGNOSIS — R41841 Cognitive communication deficit: Secondary | ICD-10-CM | POA: Diagnosis not present

## 2020-11-18 DIAGNOSIS — R2689 Other abnormalities of gait and mobility: Secondary | ICD-10-CM | POA: Diagnosis not present

## 2020-11-22 DIAGNOSIS — H25812 Combined forms of age-related cataract, left eye: Secondary | ICD-10-CM | POA: Diagnosis not present

## 2020-11-22 DIAGNOSIS — H2512 Age-related nuclear cataract, left eye: Secondary | ICD-10-CM | POA: Diagnosis not present

## 2020-12-09 DIAGNOSIS — F330 Major depressive disorder, recurrent, mild: Secondary | ICD-10-CM | POA: Diagnosis not present

## 2020-12-09 DIAGNOSIS — I10 Essential (primary) hypertension: Secondary | ICD-10-CM | POA: Diagnosis not present

## 2020-12-09 DIAGNOSIS — E785 Hyperlipidemia, unspecified: Secondary | ICD-10-CM | POA: Diagnosis not present

## 2020-12-23 DIAGNOSIS — H353231 Exudative age-related macular degeneration, bilateral, with active choroidal neovascularization: Secondary | ICD-10-CM | POA: Diagnosis not present

## 2020-12-23 DIAGNOSIS — H353211 Exudative age-related macular degeneration, right eye, with active choroidal neovascularization: Secondary | ICD-10-CM | POA: Diagnosis not present

## 2020-12-23 DIAGNOSIS — H353221 Exudative age-related macular degeneration, left eye, with active choroidal neovascularization: Secondary | ICD-10-CM | POA: Diagnosis not present

## 2020-12-24 DIAGNOSIS — L602 Onychogryphosis: Secondary | ICD-10-CM | POA: Diagnosis not present

## 2020-12-24 DIAGNOSIS — I739 Peripheral vascular disease, unspecified: Secondary | ICD-10-CM | POA: Diagnosis not present

## 2021-01-02 DIAGNOSIS — H2511 Age-related nuclear cataract, right eye: Secondary | ICD-10-CM | POA: Diagnosis not present

## 2021-01-13 DIAGNOSIS — E785 Hyperlipidemia, unspecified: Secondary | ICD-10-CM | POA: Diagnosis not present

## 2021-01-13 DIAGNOSIS — I10 Essential (primary) hypertension: Secondary | ICD-10-CM | POA: Diagnosis not present

## 2021-01-13 DIAGNOSIS — F330 Major depressive disorder, recurrent, mild: Secondary | ICD-10-CM | POA: Diagnosis not present

## 2021-01-16 DIAGNOSIS — F5101 Primary insomnia: Secondary | ICD-10-CM | POA: Diagnosis not present

## 2021-01-16 DIAGNOSIS — F0390 Unspecified dementia without behavioral disturbance: Secondary | ICD-10-CM | POA: Diagnosis not present

## 2021-01-16 DIAGNOSIS — F32A Depression, unspecified: Secondary | ICD-10-CM | POA: Diagnosis not present

## 2021-01-23 DIAGNOSIS — Z23 Encounter for immunization: Secondary | ICD-10-CM | POA: Diagnosis not present

## 2021-01-29 DIAGNOSIS — H353221 Exudative age-related macular degeneration, left eye, with active choroidal neovascularization: Secondary | ICD-10-CM | POA: Diagnosis not present

## 2021-01-29 DIAGNOSIS — H353231 Exudative age-related macular degeneration, bilateral, with active choroidal neovascularization: Secondary | ICD-10-CM | POA: Diagnosis not present

## 2021-01-29 DIAGNOSIS — H353211 Exudative age-related macular degeneration, right eye, with active choroidal neovascularization: Secondary | ICD-10-CM | POA: Diagnosis not present

## 2021-02-12 DIAGNOSIS — F330 Major depressive disorder, recurrent, mild: Secondary | ICD-10-CM | POA: Diagnosis not present

## 2021-02-12 DIAGNOSIS — I10 Essential (primary) hypertension: Secondary | ICD-10-CM | POA: Diagnosis not present

## 2021-02-12 DIAGNOSIS — E785 Hyperlipidemia, unspecified: Secondary | ICD-10-CM | POA: Diagnosis not present

## 2021-02-13 DIAGNOSIS — F0390 Unspecified dementia without behavioral disturbance: Secondary | ICD-10-CM | POA: Diagnosis not present

## 2021-02-13 DIAGNOSIS — F32A Depression, unspecified: Secondary | ICD-10-CM | POA: Diagnosis not present

## 2021-02-13 DIAGNOSIS — F5101 Primary insomnia: Secondary | ICD-10-CM | POA: Diagnosis not present

## 2021-03-11 DIAGNOSIS — Z23 Encounter for immunization: Secondary | ICD-10-CM | POA: Diagnosis not present

## 2021-03-12 DIAGNOSIS — H353211 Exudative age-related macular degeneration, right eye, with active choroidal neovascularization: Secondary | ICD-10-CM | POA: Diagnosis not present

## 2021-03-12 DIAGNOSIS — H353221 Exudative age-related macular degeneration, left eye, with active choroidal neovascularization: Secondary | ICD-10-CM | POA: Diagnosis not present

## 2021-03-12 DIAGNOSIS — H353231 Exudative age-related macular degeneration, bilateral, with active choroidal neovascularization: Secondary | ICD-10-CM | POA: Diagnosis not present

## 2021-03-17 DIAGNOSIS — E785 Hyperlipidemia, unspecified: Secondary | ICD-10-CM | POA: Diagnosis not present

## 2021-03-17 DIAGNOSIS — F330 Major depressive disorder, recurrent, mild: Secondary | ICD-10-CM | POA: Diagnosis not present

## 2021-03-17 DIAGNOSIS — I10 Essential (primary) hypertension: Secondary | ICD-10-CM | POA: Diagnosis not present

## 2021-03-19 DIAGNOSIS — F32A Depression, unspecified: Secondary | ICD-10-CM | POA: Diagnosis not present

## 2021-03-19 DIAGNOSIS — F5101 Primary insomnia: Secondary | ICD-10-CM | POA: Diagnosis not present

## 2021-03-19 DIAGNOSIS — F0390 Unspecified dementia without behavioral disturbance: Secondary | ICD-10-CM | POA: Diagnosis not present

## 2021-04-11 DIAGNOSIS — H353221 Exudative age-related macular degeneration, left eye, with active choroidal neovascularization: Secondary | ICD-10-CM | POA: Diagnosis not present

## 2021-04-11 DIAGNOSIS — H353211 Exudative age-related macular degeneration, right eye, with active choroidal neovascularization: Secondary | ICD-10-CM | POA: Diagnosis not present

## 2021-04-16 DIAGNOSIS — E785 Hyperlipidemia, unspecified: Secondary | ICD-10-CM | POA: Diagnosis not present

## 2021-04-16 DIAGNOSIS — I10 Essential (primary) hypertension: Secondary | ICD-10-CM | POA: Diagnosis not present

## 2021-04-16 DIAGNOSIS — F330 Major depressive disorder, recurrent, mild: Secondary | ICD-10-CM | POA: Diagnosis not present

## 2021-05-16 DIAGNOSIS — F32A Depression, unspecified: Secondary | ICD-10-CM | POA: Diagnosis not present

## 2021-05-16 DIAGNOSIS — F02B0 Dementia in other diseases classified elsewhere, moderate, without behavioral disturbance, psychotic disturbance, mood disturbance, and anxiety: Secondary | ICD-10-CM | POA: Diagnosis not present

## 2021-05-16 DIAGNOSIS — G309 Alzheimer's disease, unspecified: Secondary | ICD-10-CM | POA: Diagnosis not present

## 2021-05-16 DIAGNOSIS — F5101 Primary insomnia: Secondary | ICD-10-CM | POA: Diagnosis not present

## 2021-05-19 DIAGNOSIS — E785 Hyperlipidemia, unspecified: Secondary | ICD-10-CM | POA: Diagnosis not present

## 2021-05-19 DIAGNOSIS — F330 Major depressive disorder, recurrent, mild: Secondary | ICD-10-CM | POA: Diagnosis not present

## 2021-05-19 DIAGNOSIS — I10 Essential (primary) hypertension: Secondary | ICD-10-CM | POA: Diagnosis not present

## 2021-05-23 DIAGNOSIS — H353231 Exudative age-related macular degeneration, bilateral, with active choroidal neovascularization: Secondary | ICD-10-CM | POA: Diagnosis not present

## 2021-06-09 DIAGNOSIS — M6281 Muscle weakness (generalized): Secondary | ICD-10-CM | POA: Diagnosis not present

## 2021-06-09 DIAGNOSIS — R2689 Other abnormalities of gait and mobility: Secondary | ICD-10-CM | POA: Diagnosis not present

## 2021-06-09 DIAGNOSIS — G309 Alzheimer's disease, unspecified: Secondary | ICD-10-CM | POA: Diagnosis not present

## 2021-06-11 DIAGNOSIS — R2689 Other abnormalities of gait and mobility: Secondary | ICD-10-CM | POA: Diagnosis not present

## 2021-06-11 DIAGNOSIS — M6281 Muscle weakness (generalized): Secondary | ICD-10-CM | POA: Diagnosis not present

## 2021-06-11 DIAGNOSIS — G309 Alzheimer's disease, unspecified: Secondary | ICD-10-CM | POA: Diagnosis not present

## 2021-06-12 DIAGNOSIS — G309 Alzheimer's disease, unspecified: Secondary | ICD-10-CM | POA: Diagnosis not present

## 2021-06-12 DIAGNOSIS — R2689 Other abnormalities of gait and mobility: Secondary | ICD-10-CM | POA: Diagnosis not present

## 2021-06-12 DIAGNOSIS — M6281 Muscle weakness (generalized): Secondary | ICD-10-CM | POA: Diagnosis not present

## 2021-06-13 DIAGNOSIS — R2689 Other abnormalities of gait and mobility: Secondary | ICD-10-CM | POA: Diagnosis not present

## 2021-06-13 DIAGNOSIS — G309 Alzheimer's disease, unspecified: Secondary | ICD-10-CM | POA: Diagnosis not present

## 2021-06-13 DIAGNOSIS — M6281 Muscle weakness (generalized): Secondary | ICD-10-CM | POA: Diagnosis not present

## 2021-06-14 DIAGNOSIS — R2689 Other abnormalities of gait and mobility: Secondary | ICD-10-CM | POA: Diagnosis not present

## 2021-06-14 DIAGNOSIS — G309 Alzheimer's disease, unspecified: Secondary | ICD-10-CM | POA: Diagnosis not present

## 2021-06-14 DIAGNOSIS — M6281 Muscle weakness (generalized): Secondary | ICD-10-CM | POA: Diagnosis not present

## 2021-06-16 DIAGNOSIS — M6281 Muscle weakness (generalized): Secondary | ICD-10-CM | POA: Diagnosis not present

## 2021-06-16 DIAGNOSIS — G309 Alzheimer's disease, unspecified: Secondary | ICD-10-CM | POA: Diagnosis not present

## 2021-06-16 DIAGNOSIS — R2689 Other abnormalities of gait and mobility: Secondary | ICD-10-CM | POA: Diagnosis not present

## 2021-06-17 DIAGNOSIS — M6281 Muscle weakness (generalized): Secondary | ICD-10-CM | POA: Diagnosis not present

## 2021-06-17 DIAGNOSIS — R2689 Other abnormalities of gait and mobility: Secondary | ICD-10-CM | POA: Diagnosis not present

## 2021-06-17 DIAGNOSIS — I739 Peripheral vascular disease, unspecified: Secondary | ICD-10-CM | POA: Diagnosis not present

## 2021-06-17 DIAGNOSIS — G309 Alzheimer's disease, unspecified: Secondary | ICD-10-CM | POA: Diagnosis not present

## 2021-06-17 DIAGNOSIS — F330 Major depressive disorder, recurrent, mild: Secondary | ICD-10-CM | POA: Diagnosis not present

## 2021-06-17 DIAGNOSIS — I10 Essential (primary) hypertension: Secondary | ICD-10-CM | POA: Diagnosis not present

## 2021-06-17 DIAGNOSIS — E785 Hyperlipidemia, unspecified: Secondary | ICD-10-CM | POA: Diagnosis not present

## 2021-06-17 DIAGNOSIS — B351 Tinea unguium: Secondary | ICD-10-CM | POA: Diagnosis not present

## 2021-06-17 DIAGNOSIS — R600 Localized edema: Secondary | ICD-10-CM | POA: Diagnosis not present

## 2021-06-18 DIAGNOSIS — R2689 Other abnormalities of gait and mobility: Secondary | ICD-10-CM | POA: Diagnosis not present

## 2021-06-18 DIAGNOSIS — G309 Alzheimer's disease, unspecified: Secondary | ICD-10-CM | POA: Diagnosis not present

## 2021-06-18 DIAGNOSIS — M6281 Muscle weakness (generalized): Secondary | ICD-10-CM | POA: Diagnosis not present

## 2021-06-19 DIAGNOSIS — F32A Depression, unspecified: Secondary | ICD-10-CM | POA: Diagnosis not present

## 2021-06-19 DIAGNOSIS — M6281 Muscle weakness (generalized): Secondary | ICD-10-CM | POA: Diagnosis not present

## 2021-06-19 DIAGNOSIS — R2689 Other abnormalities of gait and mobility: Secondary | ICD-10-CM | POA: Diagnosis not present

## 2021-06-19 DIAGNOSIS — F02B0 Dementia in other diseases classified elsewhere, moderate, without behavioral disturbance, psychotic disturbance, mood disturbance, and anxiety: Secondary | ICD-10-CM | POA: Diagnosis not present

## 2021-06-19 DIAGNOSIS — G309 Alzheimer's disease, unspecified: Secondary | ICD-10-CM | POA: Diagnosis not present

## 2021-06-19 DIAGNOSIS — F5101 Primary insomnia: Secondary | ICD-10-CM | POA: Diagnosis not present

## 2021-06-20 DIAGNOSIS — G309 Alzheimer's disease, unspecified: Secondary | ICD-10-CM | POA: Diagnosis not present

## 2021-06-20 DIAGNOSIS — R2689 Other abnormalities of gait and mobility: Secondary | ICD-10-CM | POA: Diagnosis not present

## 2021-06-20 DIAGNOSIS — M6281 Muscle weakness (generalized): Secondary | ICD-10-CM | POA: Diagnosis not present

## 2021-06-23 DIAGNOSIS — R2689 Other abnormalities of gait and mobility: Secondary | ICD-10-CM | POA: Diagnosis not present

## 2021-06-23 DIAGNOSIS — G309 Alzheimer's disease, unspecified: Secondary | ICD-10-CM | POA: Diagnosis not present

## 2021-06-23 DIAGNOSIS — M6281 Muscle weakness (generalized): Secondary | ICD-10-CM | POA: Diagnosis not present

## 2021-06-24 DIAGNOSIS — M6281 Muscle weakness (generalized): Secondary | ICD-10-CM | POA: Diagnosis not present

## 2021-06-24 DIAGNOSIS — G309 Alzheimer's disease, unspecified: Secondary | ICD-10-CM | POA: Diagnosis not present

## 2021-06-24 DIAGNOSIS — R2689 Other abnormalities of gait and mobility: Secondary | ICD-10-CM | POA: Diagnosis not present

## 2021-06-25 DIAGNOSIS — R41841 Cognitive communication deficit: Secondary | ICD-10-CM | POA: Diagnosis not present

## 2021-06-25 DIAGNOSIS — R2689 Other abnormalities of gait and mobility: Secondary | ICD-10-CM | POA: Diagnosis not present

## 2021-06-25 DIAGNOSIS — M6281 Muscle weakness (generalized): Secondary | ICD-10-CM | POA: Diagnosis not present

## 2021-06-25 DIAGNOSIS — G309 Alzheimer's disease, unspecified: Secondary | ICD-10-CM | POA: Diagnosis not present

## 2021-06-26 DIAGNOSIS — R2689 Other abnormalities of gait and mobility: Secondary | ICD-10-CM | POA: Diagnosis not present

## 2021-06-26 DIAGNOSIS — R41841 Cognitive communication deficit: Secondary | ICD-10-CM | POA: Diagnosis not present

## 2021-06-26 DIAGNOSIS — G309 Alzheimer's disease, unspecified: Secondary | ICD-10-CM | POA: Diagnosis not present

## 2021-06-26 DIAGNOSIS — M6281 Muscle weakness (generalized): Secondary | ICD-10-CM | POA: Diagnosis not present

## 2021-06-27 DIAGNOSIS — R41841 Cognitive communication deficit: Secondary | ICD-10-CM | POA: Diagnosis not present

## 2021-06-27 DIAGNOSIS — G309 Alzheimer's disease, unspecified: Secondary | ICD-10-CM | POA: Diagnosis not present

## 2021-06-27 DIAGNOSIS — M6281 Muscle weakness (generalized): Secondary | ICD-10-CM | POA: Diagnosis not present

## 2021-06-27 DIAGNOSIS — R2689 Other abnormalities of gait and mobility: Secondary | ICD-10-CM | POA: Diagnosis not present

## 2021-07-01 DIAGNOSIS — M6281 Muscle weakness (generalized): Secondary | ICD-10-CM | POA: Diagnosis not present

## 2021-07-01 DIAGNOSIS — G309 Alzheimer's disease, unspecified: Secondary | ICD-10-CM | POA: Diagnosis not present

## 2021-07-01 DIAGNOSIS — R2689 Other abnormalities of gait and mobility: Secondary | ICD-10-CM | POA: Diagnosis not present

## 2021-07-01 DIAGNOSIS — R41841 Cognitive communication deficit: Secondary | ICD-10-CM | POA: Diagnosis not present

## 2021-07-02 DIAGNOSIS — M6281 Muscle weakness (generalized): Secondary | ICD-10-CM | POA: Diagnosis not present

## 2021-07-02 DIAGNOSIS — G309 Alzheimer's disease, unspecified: Secondary | ICD-10-CM | POA: Diagnosis not present

## 2021-07-02 DIAGNOSIS — R41841 Cognitive communication deficit: Secondary | ICD-10-CM | POA: Diagnosis not present

## 2021-07-02 DIAGNOSIS — R2689 Other abnormalities of gait and mobility: Secondary | ICD-10-CM | POA: Diagnosis not present

## 2021-07-03 DIAGNOSIS — G309 Alzheimer's disease, unspecified: Secondary | ICD-10-CM | POA: Diagnosis not present

## 2021-07-03 DIAGNOSIS — M6281 Muscle weakness (generalized): Secondary | ICD-10-CM | POA: Diagnosis not present

## 2021-07-03 DIAGNOSIS — R41841 Cognitive communication deficit: Secondary | ICD-10-CM | POA: Diagnosis not present

## 2021-07-03 DIAGNOSIS — R2689 Other abnormalities of gait and mobility: Secondary | ICD-10-CM | POA: Diagnosis not present

## 2021-07-04 DIAGNOSIS — H353231 Exudative age-related macular degeneration, bilateral, with active choroidal neovascularization: Secondary | ICD-10-CM | POA: Diagnosis not present

## 2021-07-08 DIAGNOSIS — G309 Alzheimer's disease, unspecified: Secondary | ICD-10-CM | POA: Diagnosis not present

## 2021-07-08 DIAGNOSIS — R41841 Cognitive communication deficit: Secondary | ICD-10-CM | POA: Diagnosis not present

## 2021-07-08 DIAGNOSIS — R2689 Other abnormalities of gait and mobility: Secondary | ICD-10-CM | POA: Diagnosis not present

## 2021-07-08 DIAGNOSIS — M6281 Muscle weakness (generalized): Secondary | ICD-10-CM | POA: Diagnosis not present

## 2021-07-09 DIAGNOSIS — R2689 Other abnormalities of gait and mobility: Secondary | ICD-10-CM | POA: Diagnosis not present

## 2021-07-09 DIAGNOSIS — G309 Alzheimer's disease, unspecified: Secondary | ICD-10-CM | POA: Diagnosis not present

## 2021-07-09 DIAGNOSIS — M6281 Muscle weakness (generalized): Secondary | ICD-10-CM | POA: Diagnosis not present

## 2021-07-09 DIAGNOSIS — R41841 Cognitive communication deficit: Secondary | ICD-10-CM | POA: Diagnosis not present

## 2021-07-10 DIAGNOSIS — R2689 Other abnormalities of gait and mobility: Secondary | ICD-10-CM | POA: Diagnosis not present

## 2021-07-10 DIAGNOSIS — R41841 Cognitive communication deficit: Secondary | ICD-10-CM | POA: Diagnosis not present

## 2021-07-10 DIAGNOSIS — G309 Alzheimer's disease, unspecified: Secondary | ICD-10-CM | POA: Diagnosis not present

## 2021-07-10 DIAGNOSIS — M6281 Muscle weakness (generalized): Secondary | ICD-10-CM | POA: Diagnosis not present

## 2021-07-11 DIAGNOSIS — M6281 Muscle weakness (generalized): Secondary | ICD-10-CM | POA: Diagnosis not present

## 2021-07-11 DIAGNOSIS — R41841 Cognitive communication deficit: Secondary | ICD-10-CM | POA: Diagnosis not present

## 2021-07-11 DIAGNOSIS — R2689 Other abnormalities of gait and mobility: Secondary | ICD-10-CM | POA: Diagnosis not present

## 2021-07-11 DIAGNOSIS — G309 Alzheimer's disease, unspecified: Secondary | ICD-10-CM | POA: Diagnosis not present

## 2021-07-15 DIAGNOSIS — G309 Alzheimer's disease, unspecified: Secondary | ICD-10-CM | POA: Diagnosis not present

## 2021-07-15 DIAGNOSIS — M6281 Muscle weakness (generalized): Secondary | ICD-10-CM | POA: Diagnosis not present

## 2021-07-15 DIAGNOSIS — R2689 Other abnormalities of gait and mobility: Secondary | ICD-10-CM | POA: Diagnosis not present

## 2021-07-15 DIAGNOSIS — R41841 Cognitive communication deficit: Secondary | ICD-10-CM | POA: Diagnosis not present

## 2021-07-16 DIAGNOSIS — M6281 Muscle weakness (generalized): Secondary | ICD-10-CM | POA: Diagnosis not present

## 2021-07-16 DIAGNOSIS — R2689 Other abnormalities of gait and mobility: Secondary | ICD-10-CM | POA: Diagnosis not present

## 2021-07-16 DIAGNOSIS — E785 Hyperlipidemia, unspecified: Secondary | ICD-10-CM | POA: Diagnosis not present

## 2021-07-16 DIAGNOSIS — R41841 Cognitive communication deficit: Secondary | ICD-10-CM | POA: Diagnosis not present

## 2021-07-16 DIAGNOSIS — G309 Alzheimer's disease, unspecified: Secondary | ICD-10-CM | POA: Diagnosis not present

## 2021-07-16 DIAGNOSIS — I10 Essential (primary) hypertension: Secondary | ICD-10-CM | POA: Diagnosis not present

## 2021-07-16 DIAGNOSIS — F330 Major depressive disorder, recurrent, mild: Secondary | ICD-10-CM | POA: Diagnosis not present

## 2021-07-17 DIAGNOSIS — G309 Alzheimer's disease, unspecified: Secondary | ICD-10-CM | POA: Diagnosis not present

## 2021-07-17 DIAGNOSIS — R41841 Cognitive communication deficit: Secondary | ICD-10-CM | POA: Diagnosis not present

## 2021-07-17 DIAGNOSIS — M6281 Muscle weakness (generalized): Secondary | ICD-10-CM | POA: Diagnosis not present

## 2021-07-17 DIAGNOSIS — R2689 Other abnormalities of gait and mobility: Secondary | ICD-10-CM | POA: Diagnosis not present

## 2021-07-18 DIAGNOSIS — R2689 Other abnormalities of gait and mobility: Secondary | ICD-10-CM | POA: Diagnosis not present

## 2021-07-18 DIAGNOSIS — R41841 Cognitive communication deficit: Secondary | ICD-10-CM | POA: Diagnosis not present

## 2021-07-18 DIAGNOSIS — M6281 Muscle weakness (generalized): Secondary | ICD-10-CM | POA: Diagnosis not present

## 2021-07-18 DIAGNOSIS — G309 Alzheimer's disease, unspecified: Secondary | ICD-10-CM | POA: Diagnosis not present

## 2021-07-19 DIAGNOSIS — R41841 Cognitive communication deficit: Secondary | ICD-10-CM | POA: Diagnosis not present

## 2021-07-19 DIAGNOSIS — G309 Alzheimer's disease, unspecified: Secondary | ICD-10-CM | POA: Diagnosis not present

## 2021-07-19 DIAGNOSIS — M6281 Muscle weakness (generalized): Secondary | ICD-10-CM | POA: Diagnosis not present

## 2021-07-19 DIAGNOSIS — R2689 Other abnormalities of gait and mobility: Secondary | ICD-10-CM | POA: Diagnosis not present

## 2021-07-21 DIAGNOSIS — M6281 Muscle weakness (generalized): Secondary | ICD-10-CM | POA: Diagnosis not present

## 2021-07-21 DIAGNOSIS — G309 Alzheimer's disease, unspecified: Secondary | ICD-10-CM | POA: Diagnosis not present

## 2021-07-21 DIAGNOSIS — R41841 Cognitive communication deficit: Secondary | ICD-10-CM | POA: Diagnosis not present

## 2021-07-21 DIAGNOSIS — R2689 Other abnormalities of gait and mobility: Secondary | ICD-10-CM | POA: Diagnosis not present

## 2021-07-22 DIAGNOSIS — R41841 Cognitive communication deficit: Secondary | ICD-10-CM | POA: Diagnosis not present

## 2021-07-22 DIAGNOSIS — M6281 Muscle weakness (generalized): Secondary | ICD-10-CM | POA: Diagnosis not present

## 2021-07-22 DIAGNOSIS — G309 Alzheimer's disease, unspecified: Secondary | ICD-10-CM | POA: Diagnosis not present

## 2021-07-22 DIAGNOSIS — R2689 Other abnormalities of gait and mobility: Secondary | ICD-10-CM | POA: Diagnosis not present

## 2021-07-23 DIAGNOSIS — R41841 Cognitive communication deficit: Secondary | ICD-10-CM | POA: Diagnosis not present

## 2021-07-23 DIAGNOSIS — G309 Alzheimer's disease, unspecified: Secondary | ICD-10-CM | POA: Diagnosis not present

## 2021-07-23 DIAGNOSIS — R2689 Other abnormalities of gait and mobility: Secondary | ICD-10-CM | POA: Diagnosis not present

## 2021-07-23 DIAGNOSIS — M6281 Muscle weakness (generalized): Secondary | ICD-10-CM | POA: Diagnosis not present

## 2021-07-24 DIAGNOSIS — M6281 Muscle weakness (generalized): Secondary | ICD-10-CM | POA: Diagnosis not present

## 2021-07-24 DIAGNOSIS — R41841 Cognitive communication deficit: Secondary | ICD-10-CM | POA: Diagnosis not present

## 2021-07-24 DIAGNOSIS — G309 Alzheimer's disease, unspecified: Secondary | ICD-10-CM | POA: Diagnosis not present

## 2021-07-24 DIAGNOSIS — R2689 Other abnormalities of gait and mobility: Secondary | ICD-10-CM | POA: Diagnosis not present

## 2021-07-25 DIAGNOSIS — R41841 Cognitive communication deficit: Secondary | ICD-10-CM | POA: Diagnosis not present

## 2021-07-25 DIAGNOSIS — M6281 Muscle weakness (generalized): Secondary | ICD-10-CM | POA: Diagnosis not present

## 2021-07-25 DIAGNOSIS — R2689 Other abnormalities of gait and mobility: Secondary | ICD-10-CM | POA: Diagnosis not present

## 2021-07-25 DIAGNOSIS — G309 Alzheimer's disease, unspecified: Secondary | ICD-10-CM | POA: Diagnosis not present

## 2021-07-26 DIAGNOSIS — R41841 Cognitive communication deficit: Secondary | ICD-10-CM | POA: Diagnosis not present

## 2021-07-26 DIAGNOSIS — M6281 Muscle weakness (generalized): Secondary | ICD-10-CM | POA: Diagnosis not present

## 2021-07-26 DIAGNOSIS — G309 Alzheimer's disease, unspecified: Secondary | ICD-10-CM | POA: Diagnosis not present

## 2021-07-26 DIAGNOSIS — R2689 Other abnormalities of gait and mobility: Secondary | ICD-10-CM | POA: Diagnosis not present

## 2021-07-28 DIAGNOSIS — G309 Alzheimer's disease, unspecified: Secondary | ICD-10-CM | POA: Diagnosis not present

## 2021-07-28 DIAGNOSIS — R2689 Other abnormalities of gait and mobility: Secondary | ICD-10-CM | POA: Diagnosis not present

## 2021-07-28 DIAGNOSIS — R41841 Cognitive communication deficit: Secondary | ICD-10-CM | POA: Diagnosis not present

## 2021-07-28 DIAGNOSIS — M6281 Muscle weakness (generalized): Secondary | ICD-10-CM | POA: Diagnosis not present

## 2021-07-29 DIAGNOSIS — R41841 Cognitive communication deficit: Secondary | ICD-10-CM | POA: Diagnosis not present

## 2021-07-29 DIAGNOSIS — R2689 Other abnormalities of gait and mobility: Secondary | ICD-10-CM | POA: Diagnosis not present

## 2021-07-29 DIAGNOSIS — G309 Alzheimer's disease, unspecified: Secondary | ICD-10-CM | POA: Diagnosis not present

## 2021-07-29 DIAGNOSIS — M6281 Muscle weakness (generalized): Secondary | ICD-10-CM | POA: Diagnosis not present

## 2021-07-30 DIAGNOSIS — R2689 Other abnormalities of gait and mobility: Secondary | ICD-10-CM | POA: Diagnosis not present

## 2021-07-30 DIAGNOSIS — R41841 Cognitive communication deficit: Secondary | ICD-10-CM | POA: Diagnosis not present

## 2021-07-30 DIAGNOSIS — M6281 Muscle weakness (generalized): Secondary | ICD-10-CM | POA: Diagnosis not present

## 2021-07-30 DIAGNOSIS — G309 Alzheimer's disease, unspecified: Secondary | ICD-10-CM | POA: Diagnosis not present

## 2021-07-31 DIAGNOSIS — R41841 Cognitive communication deficit: Secondary | ICD-10-CM | POA: Diagnosis not present

## 2021-07-31 DIAGNOSIS — G309 Alzheimer's disease, unspecified: Secondary | ICD-10-CM | POA: Diagnosis not present

## 2021-07-31 DIAGNOSIS — R2689 Other abnormalities of gait and mobility: Secondary | ICD-10-CM | POA: Diagnosis not present

## 2021-07-31 DIAGNOSIS — M6281 Muscle weakness (generalized): Secondary | ICD-10-CM | POA: Diagnosis not present

## 2021-08-01 DIAGNOSIS — R2689 Other abnormalities of gait and mobility: Secondary | ICD-10-CM | POA: Diagnosis not present

## 2021-08-01 DIAGNOSIS — M6281 Muscle weakness (generalized): Secondary | ICD-10-CM | POA: Diagnosis not present

## 2021-08-01 DIAGNOSIS — R41841 Cognitive communication deficit: Secondary | ICD-10-CM | POA: Diagnosis not present

## 2021-08-01 DIAGNOSIS — G309 Alzheimer's disease, unspecified: Secondary | ICD-10-CM | POA: Diagnosis not present

## 2021-08-02 DIAGNOSIS — R41841 Cognitive communication deficit: Secondary | ICD-10-CM | POA: Diagnosis not present

## 2021-08-02 DIAGNOSIS — G309 Alzheimer's disease, unspecified: Secondary | ICD-10-CM | POA: Diagnosis not present

## 2021-08-02 DIAGNOSIS — R2689 Other abnormalities of gait and mobility: Secondary | ICD-10-CM | POA: Diagnosis not present

## 2021-08-02 DIAGNOSIS — M6281 Muscle weakness (generalized): Secondary | ICD-10-CM | POA: Diagnosis not present

## 2021-08-05 DIAGNOSIS — M6281 Muscle weakness (generalized): Secondary | ICD-10-CM | POA: Diagnosis not present

## 2021-08-05 DIAGNOSIS — G309 Alzheimer's disease, unspecified: Secondary | ICD-10-CM | POA: Diagnosis not present

## 2021-08-05 DIAGNOSIS — R2689 Other abnormalities of gait and mobility: Secondary | ICD-10-CM | POA: Diagnosis not present

## 2021-08-05 DIAGNOSIS — R41841 Cognitive communication deficit: Secondary | ICD-10-CM | POA: Diagnosis not present

## 2021-08-06 DIAGNOSIS — R41841 Cognitive communication deficit: Secondary | ICD-10-CM | POA: Diagnosis not present

## 2021-08-06 DIAGNOSIS — M6281 Muscle weakness (generalized): Secondary | ICD-10-CM | POA: Diagnosis not present

## 2021-08-06 DIAGNOSIS — R2689 Other abnormalities of gait and mobility: Secondary | ICD-10-CM | POA: Diagnosis not present

## 2021-08-06 DIAGNOSIS — G309 Alzheimer's disease, unspecified: Secondary | ICD-10-CM | POA: Diagnosis not present

## 2021-08-07 DIAGNOSIS — R41841 Cognitive communication deficit: Secondary | ICD-10-CM | POA: Diagnosis not present

## 2021-08-07 DIAGNOSIS — G309 Alzheimer's disease, unspecified: Secondary | ICD-10-CM | POA: Diagnosis not present

## 2021-08-07 DIAGNOSIS — M6281 Muscle weakness (generalized): Secondary | ICD-10-CM | POA: Diagnosis not present

## 2021-08-07 DIAGNOSIS — R2689 Other abnormalities of gait and mobility: Secondary | ICD-10-CM | POA: Diagnosis not present

## 2021-08-10 DIAGNOSIS — R2689 Other abnormalities of gait and mobility: Secondary | ICD-10-CM | POA: Diagnosis not present

## 2021-08-10 DIAGNOSIS — R41841 Cognitive communication deficit: Secondary | ICD-10-CM | POA: Diagnosis not present

## 2021-08-10 DIAGNOSIS — M6281 Muscle weakness (generalized): Secondary | ICD-10-CM | POA: Diagnosis not present

## 2021-08-10 DIAGNOSIS — G309 Alzheimer's disease, unspecified: Secondary | ICD-10-CM | POA: Diagnosis not present

## 2021-08-11 DIAGNOSIS — R2689 Other abnormalities of gait and mobility: Secondary | ICD-10-CM | POA: Diagnosis not present

## 2021-08-11 DIAGNOSIS — R41841 Cognitive communication deficit: Secondary | ICD-10-CM | POA: Diagnosis not present

## 2021-08-11 DIAGNOSIS — M6281 Muscle weakness (generalized): Secondary | ICD-10-CM | POA: Diagnosis not present

## 2021-08-11 DIAGNOSIS — G309 Alzheimer's disease, unspecified: Secondary | ICD-10-CM | POA: Diagnosis not present

## 2021-08-12 DIAGNOSIS — G309 Alzheimer's disease, unspecified: Secondary | ICD-10-CM | POA: Diagnosis not present

## 2021-08-12 DIAGNOSIS — R41841 Cognitive communication deficit: Secondary | ICD-10-CM | POA: Diagnosis not present

## 2021-08-12 DIAGNOSIS — M6281 Muscle weakness (generalized): Secondary | ICD-10-CM | POA: Diagnosis not present

## 2021-08-12 DIAGNOSIS — R2689 Other abnormalities of gait and mobility: Secondary | ICD-10-CM | POA: Diagnosis not present

## 2021-08-13 DIAGNOSIS — G309 Alzheimer's disease, unspecified: Secondary | ICD-10-CM | POA: Diagnosis not present

## 2021-08-13 DIAGNOSIS — R41841 Cognitive communication deficit: Secondary | ICD-10-CM | POA: Diagnosis not present

## 2021-08-13 DIAGNOSIS — R2689 Other abnormalities of gait and mobility: Secondary | ICD-10-CM | POA: Diagnosis not present

## 2021-08-13 DIAGNOSIS — M6281 Muscle weakness (generalized): Secondary | ICD-10-CM | POA: Diagnosis not present

## 2021-08-14 DIAGNOSIS — G309 Alzheimer's disease, unspecified: Secondary | ICD-10-CM | POA: Diagnosis not present

## 2021-08-14 DIAGNOSIS — M6281 Muscle weakness (generalized): Secondary | ICD-10-CM | POA: Diagnosis not present

## 2021-08-14 DIAGNOSIS — R41841 Cognitive communication deficit: Secondary | ICD-10-CM | POA: Diagnosis not present

## 2021-08-14 DIAGNOSIS — F330 Major depressive disorder, recurrent, mild: Secondary | ICD-10-CM | POA: Diagnosis not present

## 2021-08-14 DIAGNOSIS — I10 Essential (primary) hypertension: Secondary | ICD-10-CM | POA: Diagnosis not present

## 2021-08-14 DIAGNOSIS — R2689 Other abnormalities of gait and mobility: Secondary | ICD-10-CM | POA: Diagnosis not present

## 2021-08-14 DIAGNOSIS — E785 Hyperlipidemia, unspecified: Secondary | ICD-10-CM | POA: Diagnosis not present

## 2021-08-19 DIAGNOSIS — M6281 Muscle weakness (generalized): Secondary | ICD-10-CM | POA: Diagnosis not present

## 2021-08-19 DIAGNOSIS — R2689 Other abnormalities of gait and mobility: Secondary | ICD-10-CM | POA: Diagnosis not present

## 2021-08-19 DIAGNOSIS — R41841 Cognitive communication deficit: Secondary | ICD-10-CM | POA: Diagnosis not present

## 2021-08-19 DIAGNOSIS — G309 Alzheimer's disease, unspecified: Secondary | ICD-10-CM | POA: Diagnosis not present

## 2021-08-29 DIAGNOSIS — H353231 Exudative age-related macular degeneration, bilateral, with active choroidal neovascularization: Secondary | ICD-10-CM | POA: Diagnosis not present

## 2021-09-14 DIAGNOSIS — F330 Major depressive disorder, recurrent, mild: Secondary | ICD-10-CM | POA: Diagnosis not present

## 2021-09-14 DIAGNOSIS — I10 Essential (primary) hypertension: Secondary | ICD-10-CM | POA: Diagnosis not present

## 2021-09-14 DIAGNOSIS — E785 Hyperlipidemia, unspecified: Secondary | ICD-10-CM | POA: Diagnosis not present

## 2021-10-14 DIAGNOSIS — F330 Major depressive disorder, recurrent, mild: Secondary | ICD-10-CM | POA: Diagnosis not present

## 2021-10-14 DIAGNOSIS — E785 Hyperlipidemia, unspecified: Secondary | ICD-10-CM | POA: Diagnosis not present

## 2021-10-14 DIAGNOSIS — I10 Essential (primary) hypertension: Secondary | ICD-10-CM | POA: Diagnosis not present

## 2021-11-12 DIAGNOSIS — F5101 Primary insomnia: Secondary | ICD-10-CM | POA: Diagnosis not present

## 2021-11-12 DIAGNOSIS — G309 Alzheimer's disease, unspecified: Secondary | ICD-10-CM | POA: Diagnosis not present

## 2021-11-12 DIAGNOSIS — F32A Depression, unspecified: Secondary | ICD-10-CM | POA: Diagnosis not present

## 2021-11-12 DIAGNOSIS — F02B0 Dementia in other diseases classified elsewhere, moderate, without behavioral disturbance, psychotic disturbance, mood disturbance, and anxiety: Secondary | ICD-10-CM | POA: Diagnosis not present

## 2021-11-13 DIAGNOSIS — F330 Major depressive disorder, recurrent, mild: Secondary | ICD-10-CM | POA: Diagnosis not present

## 2021-11-13 DIAGNOSIS — I10 Essential (primary) hypertension: Secondary | ICD-10-CM | POA: Diagnosis not present

## 2021-11-13 DIAGNOSIS — E785 Hyperlipidemia, unspecified: Secondary | ICD-10-CM | POA: Diagnosis not present

## 2021-11-14 DIAGNOSIS — H353231 Exudative age-related macular degeneration, bilateral, with active choroidal neovascularization: Secondary | ICD-10-CM | POA: Diagnosis not present

## 2021-11-28 DIAGNOSIS — H524 Presbyopia: Secondary | ICD-10-CM | POA: Diagnosis not present

## 2021-11-28 DIAGNOSIS — Z961 Presence of intraocular lens: Secondary | ICD-10-CM | POA: Diagnosis not present

## 2021-11-28 DIAGNOSIS — H31011 Macula scars of posterior pole (postinflammatory) (post-traumatic), right eye: Secondary | ICD-10-CM | POA: Diagnosis not present

## 2021-12-14 DIAGNOSIS — F330 Major depressive disorder, recurrent, mild: Secondary | ICD-10-CM | POA: Diagnosis not present

## 2021-12-14 DIAGNOSIS — E785 Hyperlipidemia, unspecified: Secondary | ICD-10-CM | POA: Diagnosis not present

## 2021-12-14 DIAGNOSIS — I10 Essential (primary) hypertension: Secondary | ICD-10-CM | POA: Diagnosis not present

## 2021-12-22 DIAGNOSIS — Z8673 Personal history of transient ischemic attack (TIA), and cerebral infarction without residual deficits: Secondary | ICD-10-CM | POA: Diagnosis not present

## 2021-12-22 DIAGNOSIS — Z66 Do not resuscitate: Secondary | ICD-10-CM | POA: Diagnosis not present

## 2021-12-22 DIAGNOSIS — R0603 Acute respiratory distress: Secondary | ICD-10-CM | POA: Diagnosis not present

## 2021-12-22 DIAGNOSIS — I469 Cardiac arrest, cause unspecified: Secondary | ICD-10-CM | POA: Diagnosis not present

## 2021-12-22 DIAGNOSIS — R112 Nausea with vomiting, unspecified: Secondary | ICD-10-CM | POA: Diagnosis not present

## 2021-12-22 DIAGNOSIS — N189 Chronic kidney disease, unspecified: Secondary | ICD-10-CM | POA: Diagnosis not present

## 2021-12-22 DIAGNOSIS — F0390 Unspecified dementia without behavioral disturbance: Secondary | ICD-10-CM | POA: Diagnosis not present

## 2021-12-22 DIAGNOSIS — I129 Hypertensive chronic kidney disease with stage 1 through stage 4 chronic kidney disease, or unspecified chronic kidney disease: Secondary | ICD-10-CM | POA: Diagnosis not present

## 2021-12-26 DEATH — deceased

## 2022-02-25 ENCOUNTER — Ambulatory Visit: Payer: Medicare HMO | Admitting: *Deleted

## 2022-02-25 ENCOUNTER — Encounter: Payer: Self-pay | Admitting: *Deleted

## 2022-02-25 NOTE — Patient Instructions (Signed)
Visit Information  Thank you for taking time to visit with me today. Please don't hesitate to contact me if I can be of assistance to you.   Please call the care guide team at [phone] if you need to cancel or reschedule your appointment.   If you are experiencing a Mental Health or Behavioral Health Crisis or need someone to talk to, please call the Suicide and Crisis Lifeline: 988 call the [place] National Suicide Prevention Lifeline: 1-800-273-8255 or TTY: 1-800-799-4 TTY (1-800-799-4889) to talk to a trained counselor call 1-800-273-TALK (toll free, 24 hour hotline) go to [facility] [address], [place] ([phone]) call the [place] Crisis Line: 800-939-9988 call 911  Patient verbalizes understanding of instructions and care plan provided today and agrees to view in MyChart. Active MyChart status and patient understanding of how to access instructions and care plan via MyChart confirmed with patient.     No further follow up required.  Ashrith Sagan, BSW, MSW, LCSW  Licensed Clinical Social Worker  [facility] [place] [facility]  Mailing Address-[address], [place], [place] [address] Physical Address-[address], [place], [place] [address] Toll Free Main # [phone] Fax # [phone] Cell # [phone] [email or web address]            

## 2022-02-25 NOTE — Patient Outreach (Signed)
  Care Coordination   Initial Visit Note   02/25/2022  Name: STEVEE VALENTA MRN: [id number] DOB: 25-Feb-1941  ALLYSON TINEO is a 81 y.o. year old female who sees Hasanaj, Samul Dada, MD for primary care. I spoke with Berniece Pap by phone today.  What matters to the patients health and wellness today?   No Interventions Identified.  SDOH assessments and interventions completed:  Yes.  SDOH Interventions Today    Flowsheet Row Most Recent Value  SDOH Interventions   Food Insecurity Interventions Intervention Not Indicated  Housing Interventions Intervention Not Indicated  Transportation Interventions Intervention Not Indicated  Utilities Interventions Intervention Not Indicated  Alcohol Usage Interventions Intervention Not Indicated (Score <7)  Financial Strain Interventions Intervention Not Indicated  Physical Activity Interventions Patient Refused  Stress Interventions Intervention Not Indicated  Social Connections Interventions Intervention Not Indicated     Care Coordination Interventions Activated:  Yes.    Care Coordination Interventions:  Yes, provided.    Follow up plan: No further intervention required.    Encounter Outcome:  Pt. Visit Completed.    Nat Christen, BSW, MSW, LCSW  Licensed Education officer, [facility]  Mailing [place] [address], [place], [place] [address] Physical Address-[address] [place], [place] [address] Toll Free Main # [phone] Fax # [phone] Cell # [phone] [email or web address]
